# Patient Record
Sex: Female | Born: 1935 | Race: White | Hispanic: No | Smoking: Former smoker
Health system: Southern US, Community
[De-identification: ages and names within clinical notes are randomized; demographics above are authoritative.]

## PROBLEM LIST (undated history)

## (undated) DIAGNOSIS — E119 Type 2 diabetes mellitus without complications: Secondary | ICD-10-CM

## (undated) DIAGNOSIS — I10 Essential (primary) hypertension: Secondary | ICD-10-CM

## (undated) DIAGNOSIS — E7800 Pure hypercholesterolemia, unspecified: Secondary | ICD-10-CM

## (undated) HISTORY — PX: HERNIA REPAIR: SHX51

## (undated) HISTORY — PX: ABDOMINAL HYSTERECTOMY: SHX81

---

## 2004-08-18 ENCOUNTER — Ambulatory Visit: Payer: Self-pay | Admitting: Cardiology

## 2004-10-21 ENCOUNTER — Ambulatory Visit (HOSPITAL_COMMUNITY): Admission: RE | Admit: 2004-10-21 | Discharge: 2004-10-22 | Payer: Self-pay | Admitting: *Deleted

## 2004-12-23 ENCOUNTER — Ambulatory Visit: Admission: RE | Admit: 2004-12-23 | Discharge: 2004-12-23 | Payer: Self-pay | Admitting: *Deleted

## 2005-01-13 ENCOUNTER — Ambulatory Visit (HOSPITAL_COMMUNITY): Admission: RE | Admit: 2005-01-13 | Discharge: 2005-01-14 | Payer: Self-pay | Admitting: *Deleted

## 2005-02-01 ENCOUNTER — Ambulatory Visit: Payer: Self-pay | Admitting: Internal Medicine

## 2005-05-29 ENCOUNTER — Emergency Department: Payer: Self-pay | Admitting: Emergency Medicine

## 2006-02-05 ENCOUNTER — Ambulatory Visit: Payer: Self-pay | Admitting: Internal Medicine

## 2006-08-30 ENCOUNTER — Ambulatory Visit: Payer: Self-pay | Admitting: *Deleted

## 2006-10-31 ENCOUNTER — Ambulatory Visit: Payer: Self-pay | Admitting: Orthopaedic Surgery

## 2007-02-13 ENCOUNTER — Ambulatory Visit: Payer: Self-pay

## 2007-02-25 ENCOUNTER — Ambulatory Visit: Payer: Self-pay | Admitting: Otolaryngology

## 2007-05-30 ENCOUNTER — Ambulatory Visit: Payer: Self-pay | Admitting: Internal Medicine

## 2007-09-05 ENCOUNTER — Ambulatory Visit: Payer: Self-pay | Admitting: *Deleted

## 2007-10-31 ENCOUNTER — Ambulatory Visit: Payer: Self-pay | Admitting: Ophthalmology

## 2007-10-31 ENCOUNTER — Other Ambulatory Visit: Payer: Self-pay

## 2007-11-05 ENCOUNTER — Ambulatory Visit: Payer: Self-pay | Admitting: Pain Medicine

## 2007-11-11 ENCOUNTER — Ambulatory Visit: Payer: Self-pay | Admitting: Ophthalmology

## 2007-11-20 ENCOUNTER — Ambulatory Visit: Payer: Self-pay | Admitting: Pain Medicine

## 2008-02-03 ENCOUNTER — Ambulatory Visit: Payer: Self-pay | Admitting: Pain Medicine

## 2008-02-19 ENCOUNTER — Ambulatory Visit: Payer: Self-pay | Admitting: Unknown Physician Specialty

## 2008-02-25 ENCOUNTER — Ambulatory Visit: Payer: Self-pay | Admitting: Pain Medicine

## 2008-03-11 ENCOUNTER — Ambulatory Visit: Payer: Self-pay | Admitting: Physician Assistant

## 2008-05-07 ENCOUNTER — Ambulatory Visit: Payer: Self-pay | Admitting: Physician Assistant

## 2008-08-06 ENCOUNTER — Ambulatory Visit: Payer: Self-pay | Admitting: Physician Assistant

## 2008-08-18 ENCOUNTER — Ambulatory Visit: Payer: Self-pay | Admitting: Pain Medicine

## 2008-08-25 ENCOUNTER — Ambulatory Visit: Payer: Self-pay | Admitting: Internal Medicine

## 2008-08-26 ENCOUNTER — Inpatient Hospital Stay: Payer: Self-pay | Admitting: Internal Medicine

## 2008-09-04 ENCOUNTER — Ambulatory Visit: Payer: Self-pay | Admitting: Vascular Surgery

## 2008-09-09 ENCOUNTER — Ambulatory Visit: Payer: Self-pay | Admitting: Physician Assistant

## 2009-04-27 ENCOUNTER — Ambulatory Visit: Payer: Self-pay | Admitting: Pain Medicine

## 2009-05-24 ENCOUNTER — Ambulatory Visit: Payer: Self-pay | Admitting: Pain Medicine

## 2009-07-08 ENCOUNTER — Ambulatory Visit: Payer: Self-pay

## 2009-08-05 ENCOUNTER — Ambulatory Visit: Payer: Self-pay | Admitting: Pain Medicine

## 2009-08-23 ENCOUNTER — Ambulatory Visit: Payer: Self-pay | Admitting: Pain Medicine

## 2009-08-27 ENCOUNTER — Ambulatory Visit: Payer: Self-pay | Admitting: Internal Medicine

## 2009-09-07 ENCOUNTER — Ambulatory Visit: Payer: Self-pay | Admitting: Pain Medicine

## 2009-09-20 ENCOUNTER — Ambulatory Visit: Payer: Self-pay | Admitting: Pain Medicine

## 2009-10-26 ENCOUNTER — Ambulatory Visit: Payer: Self-pay | Admitting: Pain Medicine

## 2009-11-08 ENCOUNTER — Ambulatory Visit: Payer: Self-pay | Admitting: Pain Medicine

## 2010-06-24 ENCOUNTER — Ambulatory Visit: Payer: Self-pay

## 2010-08-16 NOTE — Procedures (Signed)
CAROTID DUPLEX EXAM   INDICATION:  Followup of known carotid artery disease.  Patient is  asymptomatic.   HISTORY:  Diabetes:  No.  Cardiac:  No.  Hypertension:  Yes.  Smoking:  No.  Previous Surgery:  CV History:  Patient has a history of renal artery FMD.  Amaurosis Fugax No, Paresthesias No, Hemiparesis No                                       RIGHT             LEFT  Brachial systolic pressure:         132               138  Brachial Doppler waveforms:         WNL               WNL  Vertebral direction of flow:        Antegrade         Antegrade  DUPLEX VELOCITIES (cm/sec)  CCA peak systolic                   72                67  ECA peak systolic                   95                92  ICA peak systolic                   103               149  ICA end diastolic                   27                35  PLAQUE MORPHOLOGY:                  Calcific          Mixed, calcific  PLAQUE AMOUNT:                      Mild-moderate     Moderate  PLAQUE LOCATION:                    Bifurcation, ICA  Bifurcation, ICA   IMPRESSION:  1. 20-39% internal carotid artery stenosis.  2. 40-59% internal carotid artery stenosis.  3. Patent external carotid artery stenoses.  4. Bilateral antegrade flow in vertebral arteries.  5. Study essentially unchanged from that on 08/30/06.     ___________________________________________  P. Liliane Bade, M.D.   PB/MEDQ  D:  09/05/2007  T:  09/05/2007  Job:  [id number]

## 2010-08-19 NOTE — H&P (Signed)
NAME:  Beth Leon, Beth Leon                 ACCOUNT NO.:  [id number]   MEDICAL RECORD NO.:  [id number]          PATIENT TYPE:  AMB   LOCATION:  DFTL                         FACILITY:  [facility]   PHYSICIAN:  Balinda Quails, M.D.    DATE OF BIRTH:  02-Jan-1928   DATE OF ADMISSION:  12/23/2004  DATE OF DISCHARGE:                                HISTORY & PHYSICAL   REASON FOR ADMISSION:  Renovascular hypertension.   HISTORY OF PRESENT ILLNESS:  The patient is a 77 year old white female who  was referred from Dr. Artelia Laroche for evaluation of renovascular  hypertension. The patient has had poorly controlled hypertension which has  been worsening despite maximum medications. Ultimately in the course of her  workup, she was referred to Dr. Dahlia Client and underwent cardiac  catheterization with abdominal aortogram. This showed well preserved  ejection fraction with no significant coronary artery disease, but she was  noted to have fibromuscular dysplasia in her left renal artery and into the  branches of the right renal artery. She was subsequently referred to Dr.  Liliane Bade for evaluation and consideration of possible renal artery  angioplasty and stenting. She did undergo a left renal artery angioplasty on  October 21, 2004. Since that time she has noted some improvement in her blood  pressures, mostly running in the 150-160 systolic range now. It was Dr.  Madilyn Fireman' opinion that at this time she should undergo a right renal artery  angioplasty and possible stent placement. She presents today for her  preoperative History and Physical.   PAST MEDICAL HISTORY:  1.  __________, asymptomatic (40-59% right internal carotid artery stenosis      and 60-79% left internal carotid artery stenosis by duplex).  2.  Hypertension.  3.  Dyslipidemia, diet controlled.  4.  History of gastritis and GI bleed in the past.  5.  History of diverticulosis and diverticulitis.  6.  History of left lower extremity DVT  previously on Coumadin now      discontinued.  7.  History of acute kidney failure following a bout of diverticulitis in      1997.   PAST SURGICAL HISTORY:  1.  Hiatal hernia repair with Nissen fundoplication.  2.  Hysterectomy.  3.  Bilateral oophorectomies.  4.  Rectocele repair with bladder tacking   CURRENT MEDICATIONS:  1.  Hydrochlorothiazide 25 mg daily.  2.  Lisinopril 40 mg daily.  3.  Sular 20 mg daily.  4.  Aspirin 325 mg daily.  5.  Calcium 500 mg twice daily.  6.  Multivitamin daily.   ALLERGIES:  No known drug allergies. However, she has intolerance to:  1.  CODEINE which causes nausea.  2.  LIPITOR which causes muscle aches and pains.  3.  BETA BLOCKERS which have caused significant bradycardia and hypotension.   SOCIAL HISTORY:  She is widowed and lives alone in [place]. She does have  three daughters in the area who assist her as needed. She is a retired Investment banker, operational for a trucking company. She previously smoked one to two  packs of cigarettes per day x20 years. She quit in March 21, 1974. She  consumes an occasional glass of wine with meals.   FAMILY HISTORY:  Her mother died at age 13 of Alzheimer's and also had  hypertension. Her father died at age 14 of coronary artery disease. She has  one brother who has had valvular heart disease and has undergone a valve  replacement; one sister who is living and has valvular heart disease; and  one sister who is living who has multiple sclerosis.   REVIEW OF SYSTEMS:  See History of Present Illness for pertinent positives  and negatives. Also she has some dyspnea on exertion, although it is felt  that this is not cardiac in origin. She does have periodic pedal edema as  well. She has chronic left lower extremity pain and swelling following a  DVT. She has generalized joint and muscle aches. She has most recently had  some problems with loose stools which have been occurring two to three times   daily following her last angiogram. Her family physician gave her a  prescription for some medication which seems to have helped, and her  symptoms are now improving. She denies fevers, chills, weight loss, recent  infections, visual changes, amaurosis fugax, dysphagia, dysarthria,  neurological symptoms, weakness, speech impairment, chest pain,  palpitations, shortness of breath, PND, orthopnea, cough, wheezing,  hemoptysis, abdominal pain, nausea, vomiting, reflux symptoms, hematochezia,  melena, hematemesis, hematuria, nocturia or dysuria, lower extremity  claudication symptoms or rest pain, nonhealing ulcers, anxiety, depression,  intolerance to heat or cold.   PHYSICAL EXAMINATION:  VITAL SIGNS:  Blood pressure is 140/82, heart rate is  84 and regular, respirations 18 unlabored.  GENERAL:  This is a well-developed, well-nourished white female in no acute  distress.  HEENT:  Normocephalic, atraumatic. Pupils equal, round and react to light  accommodation. Extraocular movements intact. Exam of the TMs and canals are  clear. Nares patent bilaterally. Oropharynx clear with moist mucous  membranes.  NECK:  Supple without lymphadenopathy, thyromegaly or carotid bruits.  HEART:  Regular rate and rhythm without murmurs, rubs or gallops.  LUNGS:  Clear to auscultation.  ABDOMEN:  Soft, nontender, nondistended with active bowel sounds in all  quadrants. No masses or hepatosplenomegaly.  EXTREMITIES:  There is mild left lower extremity edema with 2+ femoral,  dorsalis pedis and posterior tibial pulses bilaterally.  NEUROLOGIC: Cranial nerves II-XII are grossly intact. She is alert and  oriented x3. Gait is within normal limits.   ASSESSMENT/PLAN:  This is a 77 year old female with history of renovascular  hypertension who is status post a left renal artery percutaneous  transluminal angioplasty. She will be admitted to [facility] on December 24, 1994, and will undergo a right renal artery  percutaneous transluminal  angioplasty and stent placement performed by Dr. Liliane Bade.      Coral Ceo, P.A.      Balinda Quails, M.D.  Electronically Signed    GC/MEDQ  D:  12/21/2004  T:  12/21/2004  Job:  [id number]   cc:   Lenore Cordia, M.D.   Dahlia Client, M.D.

## 2010-08-19 NOTE — Op Note (Signed)
NAME:  Beth Leon, Beth Leon                 ACCOUNT NO.:  [id number]   MEDICAL RECORD NO.:  [id number]          PATIENT TYPE:  OIB   LOCATION:  5530                         FACILITY:  [facility]   PHYSICIAN:  Balinda Quails, M.D.    DATE OF BIRTH:  1927-11-18   DATE OF PROCEDURE:  01/13/2005  DATE OF DISCHARGE:                                 OPERATIVE REPORT   PHYSICIAN:  Denman George, MD   DIAGNOSIS:  Renovascular hypertension secondary to renal artery  fibromuscular dysplasia.   PROCEDURE:  1.  Selective left renal arteriogram.  2.  Selective right renal arteriogram.  3.  Angioplasty of superior first division superior right renal artery.  4.  Angioplasty of the first division inferior right renal artery.   ACCESS:  Left brachial artery 6-French sheath.   CONTRAST:  80 mL of Visipaque.   COMPLICATIONS:  None apparent   CLINICAL NOTE:  Beth Leon is a 77 year old female with poorly controlled  hypertension.  Workup for this revealed evidence of fibromuscular dysplasia  of the renal arteries bilaterally.  She has previously undergone left main  renal artery PTA for fibromuscular disease.  Following this, she did get an  improvement in blood pressure control, with improved blood pressure levels  and also reduction in medication.  Previous diagnostic arteriography has  revealed evidence of fibromuscular dysplasia changes in the superior and  inferior first division branches of the right renal artery.  She is brought  to the cath lab at this time for planned intervention of the fibromuscular  disease in her right kidney.   PROCEDURE NOTE:  The patient was brought to the cath lab in stable  condition.  Informed consent obtained.  Placed in supine position.  Left arm  prepped and draped in sterile fashion.   Patient administered 25 mcg of fentanyl intravenously, 1 mg of Versed  intravenously.  She did receive an additional 25 mcg of fentanyl during the  procedure.   Skin and  subcutaneous tissue in the left antecubital fossa were instilled  with 1% Xylocaine.  A needle was easily introduced into the left brachial  artery.  A 0.035 J-wire passed through the needle into the left brachial  artery.  The needle removed, a short 6-French sheath was then advanced into  the left brachial artery.  The J-wire removed.  The sheath flushed with  heparin and saline solution.  Instilled through the sheath were then 2000  units of heparin, and 200 mcg of intra-arterial nitroglycerin.   A 6-French multipurpose guide catheter was then engaged into the sheath.  A  0.035 J-wire was advanced through the catheter and up into the left brachial  artery.  Under fluoroscopy, this was advanced through the left axillary and  subclavian arteries into the aortic arch.  The guidewire coursed easily down  into the descending aorta.  The multipurpose catheter was advanced over the  guidewire.  The catheter was placed down in the distal abdominal aorta.  With hand injection of contrast, the origin of left renal artery was  identified.  This was encased easily  with a multipurpose catheter.  A 0.014  stabilizer guidewire was then advanced into the left renal artery and out  into the second division branches.  An attempt was then made to pass a wave  wire into the left renal artery.  This was advanced out into the second  division branches.  However, the wave wire did not function adequately and  accurate measurements of pressure were was not obtained.  Guidewires were  then removed from the left renal artery.   The right renal artery origin was identified with hand injection of  contrast.  The multipurpose catheter was engaged into the origin of the  right main renal artery.  Right renal arteriography obtained with hand  injection of contrast.  This did delineate clearly the fibromuscular disease  involving the right renal artery branches.  The main right renal artery  branched into a first  division superior and inferior pole branch.  The first  division branches were both significantly diseased with fibromuscular  dysplastic changes.   A 0.014 stabilizer guidewire was then advanced through the guide cath.  Using a torquing device, this was then advanced into the superior first  division branch of the right renal artery.  This was then advanced out into  the distal branches to engage the wire well.  A second 0.014 stabilizer  guidewire was then advanced through the guide catheter and using a torquing  device, this was then advanced into the inferior first division branch of  the left renal artery.  With these 2 guidewires in place, intervention was  then undertaken.   The superior first division left renal artery was dilated with a 5 x 2  Aviator balloon, 2 dilatations carried out at 4 atmospheres x30 seconds.  This balloon was then removed and a completion arteriogram obtained.  This  did reveal improvement in the fibromuscular dysplastic changes of the right  upper pole first division branch.   A 2.75 x 20 Maverick balloon was then advanced into the inferior first  division branch of the right main renal artery.  Two inflations were carried  out with the Maverick balloon at 6 atmospheres x30 seconds.  Following these  inflations, completion arteriography again obtained.  This revealed improved  appearance of the fibromuscular disease in the inferior pole first division  branch.   This completed the intervention procedure.  There were no apparent  complications.  The guidewires were then removed along with the multipurpose  guide catheter.   Total contrast load was 80 mL of Visipaque.  ACT measured prior to  intervention was 256 seconds.   The patient was transferred to the holding area in stable condition.   FINAL IMPRESSION:  1.  Fibromuscular dysplastic changes of the first division superior and      inferior right renal artery. 2.  Successful angioplasty of  fibromuscular displaced thick changes in the      right superior and inferior first division branches of the renal artery      without apparent complication.   DISPOSITION:  These results have been reviewed with the patient and family.  The patient will be observed overnight for planned discharge home tomorrow.   The patient is instructed to follow up with Dr. Graciela Husbands regarding blood  pressure control and any adjustment in medications.  Followup in the office  will be arranged for approximately 1 month.      Balinda Quails, M.D.  Electronically Signed     PGH/MEDQ  D:  01/13/2005  T:  01/14/2005  Job:  [id number]   cc:   Almon Register, [place], [place] Daniel Nones MD   [facility] Peripheral Vascular Cath Lab   [place], [place] Harold Hedge MD

## 2010-08-19 NOTE — Op Note (Signed)
NAME:  Beth Leon, Beth Leon                 ACCOUNT NO.:  [id number]   MEDICAL RECORD NO.:  [id number]          PATIENT TYPE:  OIB   LOCATION:  2899                         FACILITY:  [facility]   PHYSICIAN:  Balinda Quails, M.D.    DATE OF BIRTH:  [birth year]   DATE OF PROCEDURE:  10/21/2004  DATE OF DISCHARGE:                                 OPERATIVE REPORT   DIAGNOSIS:  Renal vascular hypertension.   PROCEDURE:  1.  Bilateral selective renal arteriogram.  2.  Left renal artery PTA.   ACCESS:  Right common femoral artery 6-French sheath.   CONTRAST:  90 mL Visipaque.   COMPLICATIONS:  None apparent.   CLINICAL NOTE:  Jeanenne Leon is a 77 year old female referred to the office  for evaluation of accelerated hypertension.  During recent heart  catheterization, she was found to have evidence of fibromuscular dysplasia  of the renal arteries.  She is brought to the cath lab at this time for  elective renal arteriography and possible percutaneous intervention.   PROCEDURE NOTE:  The patient brought to the cath lab in stable condition.  Placed in supine position.   The right groin prepped and draped in sterile fashion.  Administered 1 mg of  Versed, 25 mcg of fentanyl intravenously.  Skin and subcutaneous tissue on  the right groin instilled with 1% Xylocaine.  A needle was easily introduced  in the right common femoral artery.  A 0.035 J-wire passed through the  needle into the mid abdominal aorta.   A right coronary catheter was then advanced over the guidewire.  The  guidewire removed.  The right coronary catheter engaged into the left main  renal artery.  Selective left renal arteriography obtained.  There was a  long main renal artery.  There was extensive fibromuscular change with a  [place] lake type appearance in the distal left renal artery.  This did not  appear to involve the first division branches.   The right coronary catheter was then disengaged from the left main renal  artery.  Attempts were made to cannulate the right main renal artery.  These  were unsuccessful.  Therefore, a pigtail catheter was inserted and an  aortogram obtained.  The aortogram did reveal the origin of the right main  renal artery.  Guidewire exchange then made for an IMA catheter.  The IMA  catheter was engaged in the right main renal artery.  Right renal  arteriography obtained.   Right renal arteriography did reveal a short right main renal artery.  No  significant fibromuscular changes in the main renal artery on the right  side.  There was, however, extensive fibromuscular dysplasia changes present  in the first division branches of the right renal artery, the superior and  inferior branches.   The IMA catheter was then disengaged from the right main renal artery.  J-  wire inserted.  An IMA guide catheter was then inserted over the guidewire.  The patient administered 4000 units heparin intravenously, subsequent 1000  units heparin intravenously.  ACT measured 228.   The IMA guide  was then engaged into the left main renal artery.  Left renal  arteriography again obtained with hand injection of contrast.  This clearly  delineated the fibromuscular dysplasia changes of the left main renal  artery.  A 5 x 2 aviator balloon was then advanced over the guidewire.  The  aviator balloon was inflated at 4 atmospheres x 20 seconds, 6 atmospheres x  20 seconds, 4 atmospheres x 20 seconds, and 8 atmospheres x 20 seconds.   Completion arteriography then obtained following angioplasty of the left  main renal artery.  This did reveal marked improvement in the fibromuscular  dysplasia that was in the left main renal artery.  There were no apparent  complications.  The balloon and guidewire were removed from the left main  renal artery.  The J wire reinserted and the IMA guide removed.  Right  femoral sheath left in place until ACT appropriate for removal.  There were  no apparent  complications.  Total contrast 90 mL Visipaque.   FINAL IMPRESSION:  1.  Extensive fibromuscular dysplasia of the left main renal artery.  2.  Extensive fibromuscular dysplasia of the superior and inferior first      division branches of the left renal artery.  3.  Successful left main renal artery angioplasty with 5 x 2 aviator balloon      without apparent complication.   DISPOSITION:  These results have been reviewed with the patient and family.  The patient will be observed overnight, planned discharge tomorrow.   Follow up in the office in 3-4 weeks.  At that time, she will have a carotid  Doppler evaluation.  Planned intervention of the right renal artery to  follow.       PGH/MEDQ  D:  10/21/2004  T:  10/21/2004  Job:  [id number]   cc:   [facility] Peripheral Vascular Cath Lab   Harold Hedge, MD  [place], [place]   Daniel Nones, MD  [place], [place]

## 2010-08-19 NOTE — Op Note (Signed)
NAME:  Beth Leon, Beth Leon                 ACCOUNT NO.:  [id number]   MEDICAL RECORD NO.:  [id number]          PATIENT TYPE:  AMB   LOCATION:  DFTL                         FACILITY:  [facility]   PHYSICIAN:  Balinda Quails, M.D.    DATE OF BIRTH:  October 28, 1927   DATE OF PROCEDURE:  12/23/2004  DATE OF DISCHARGE:  12/23/2004                                 OPERATIVE REPORT   PHYSICIAN:  Denman George, MD.   DIAGNOSIS:  Renovascular hypertension secondary to renal artery  fibromuscular dysplasia.   PROCEDURE:  Bilateral selective renal arteriogram.   ACCESS:  Right common femoral artery 6-French sheath.   CONTRAST:  70 mL of Visipaque.   COMPLICATIONS:  None apparent.   CLINICAL NOTE:  Beth Leon is a 77 year old female with known fibromuscular  dysplasia of the renal arteries.  She has poorly-controlled hypertension.  She has previously undergone left renal PTA for treatment of left main renal  artery fibromuscular dysplasia.  She has had a good physiologic response to  this treatment with discontinuation of 1.5 of her current antihypertensive  medications.  She is brought back to the cath lab at this time for repeat  diagnostic arteriography, reevaluation of the left renal PTA and possible  right renal PTA.   PROCEDURE NOTE:  The patient was brought to the cath lab in stable  condition.  Placed in supine position.  Right groin was prepped and draped  in a sterile fashion.   Skin and subcutaneous tissue were instilled with 1% Xylocaine.  The patient  was administered 25 mcg of fentanyl intravenously, 1 mg of Versed  intravenously.   A needle was easily introduced into the right common femoral artery.  A  0.035 J-wire was passed through the needle into the mid-abdominal aorta.  The needle was removed, 6-French sheath advanced over the guidewire.  Dilator was removed and sheath flushed with heparin and saline solution.  An  IMA guide catheter was then advanced over the guidewire.  This  was easily  engaged in the left main renal artery.  Hand injection of contrast made to  evaluate the left main renal artery.  This revealed an excellent technical  result to the previous PTA.  The area of fibromuscular dysplasia appeared  well- relieved.  There was brisk flow.   An attempt was then made to pass a pressure wire across this.  However,  there was malfunction of the pressure wire and this procedure was aborted.   The IMA catheter was then engaged into the right main renal artery.  The  right main renal artery had a marked inferior course.  This was very  difficult to engage.  Right main renal arteriography was made with hand  injection.  This revealed the right main renal artery to be relatively free  of fibromuscular disease.  There were a pair of first division branches  which revealed marked change of fibromuscular dysplasia.   Several attempts were made to engage the guide catheter well into the right  main renal artery.  However, the aorta was quite narrow in caliber.  Attempts were made with an IMA guide, also with a renal double-curved guide.  These, however, were unsatisfactory.   At this time, it was decided to discontinue the procedure.   The guide catheter was removed over a wire.  Right femoral sheath removed.  No apparent complications.   FINAL IMPRESSION:  1.  Bilateral renal artery fibromuscular dysplasia.  2.  Well-treated left main renal artery fibromuscular dysplasia, status post      percutaneous transluminal angioplasty.  3.  Fibromuscular dysplasia of the first division branches of right renal      artery.   DISPOSITION:  The patient will be electively scheduled for return for access  via the left brachial approach for scheduled PTA of the fibromuscular  disease of the right renal artery.      Balinda Quails, M.D.  Electronically Signed     PGH/MEDQ  D:  12/23/2004  T:  12/24/2004  Job:  [id number]   cc:   [facility] Peripheral Vascular Cath Lab

## 2011-10-25 ENCOUNTER — Ambulatory Visit: Payer: Self-pay | Admitting: Pain Medicine

## 2011-12-19 ENCOUNTER — Ambulatory Visit: Payer: Self-pay | Admitting: Internal Medicine

## 2012-01-18 ENCOUNTER — Ambulatory Visit: Payer: Self-pay | Admitting: Internal Medicine

## 2012-10-15 ENCOUNTER — Ambulatory Visit: Payer: Self-pay | Admitting: Internal Medicine

## 2015-09-05 ENCOUNTER — Emergency Department
Admission: EM | Admit: 2015-09-05 | Discharge: 2015-09-05 | Disposition: A | Discharge status: Home / Self Care | Payer: Medicare Other | Attending: Emergency Medicine | Admitting: Emergency Medicine

## 2015-09-05 ENCOUNTER — Encounter: Payer: Self-pay | Admitting: Medical Oncology

## 2015-09-05 DIAGNOSIS — T7840XA Allergy, unspecified, initial encounter: Secondary | ICD-10-CM | POA: Diagnosis present

## 2015-09-05 DIAGNOSIS — E119 Type 2 diabetes mellitus without complications: Secondary | ICD-10-CM | POA: Diagnosis not present

## 2015-09-05 DIAGNOSIS — R229 Localized swelling, mass and lump, unspecified: Secondary | ICD-10-CM | POA: Insufficient documentation

## 2015-09-05 DIAGNOSIS — K0889 Other specified disorders of teeth and supporting structures: Secondary | ICD-10-CM | POA: Diagnosis not present

## 2015-09-05 DIAGNOSIS — I10 Essential (primary) hypertension: Secondary | ICD-10-CM | POA: Diagnosis not present

## 2015-09-05 HISTORY — DX: Pure hypercholesterolemia, unspecified: E78.00

## 2015-09-05 HISTORY — DX: Type 2 diabetes mellitus without complications: E11.9

## 2015-09-05 HISTORY — DX: Essential (primary) hypertension: I10

## 2015-09-05 MED ORDER — PENICILLIN V POTASSIUM 250 MG PO TABS
500.0000 mg | ORAL_TABLET | Freq: Once | ORAL | Status: AC
  Administered 2015-09-05: 500 mg via ORAL
  Filled 2015-09-05: qty 2

## 2015-09-05 MED ORDER — PENICILLIN V POTASSIUM 500 MG PO TABS: 500.0000 mg | ORAL_TABLET | Freq: Four times a day (QID) | ORAL | Status: DC

## 2015-09-05 NOTE — ED Provider Notes (Signed)
[facility] Emergency Department Provider Note   ____________________________________________  Time seen: Approximately 945 AM  I have reviewed the triage vital signs and the nursing notes.   HISTORY  Chief Complaint Allergic Reaction   HPI Beth Leon is a 82 y.o. female with a history of tooth fracture to her front lower 4 teeth was presenting with pain over those teeth as well as swelling to the mandible anteriorly this morning. She says that she has been having pain over the said teeth over the past several days. She says that last night the pain became worse and she woke up with swelling to the front of her mandible today. She says ever since waking up and sitting upright that the swelling has gone down. There was no complaint of any difficulty swallowing. The patient has been eating yogurt and other soft foods. She has a Education officer, community and says she will be able to call a dentist tomorrow for further guidance. She is on lisinopril but she has been on lisinopril 40 years without incident.   Past Medical History  Diagnosis Date  . Hypertension   . Diabetes mellitus without complication (HCC)   . High cholesterol     There are no active problems to display for this patient.   No past surgical history on file.  No current outpatient prescriptions on file.  Allergies Codeine  No family history on file.  Social History Social History  Substance Use Topics  . Smoking status: None  . Smokeless tobacco: None  . Alcohol Use: None    Review of Systems Constitutional: No fever/chills Eyes: No visual changes. ENT: No sore throat. Cardiovascular: Denies chest pain. Respiratory: Denies shortness of breath. Gastrointestinal: No abdominal pain.  No nausea, no vomiting.  No diarrhea.  No constipation. Genitourinary: Negative for dysuria. Musculoskeletal: Negative for back pain. Skin: Negative for rash. Neurological: Negative for headaches, focal  weakness or numbness.  10-point ROS otherwise negative.  ____________________________________________   PHYSICAL EXAM:  VITAL SIGNS: ED Triage Vitals  Enc Vitals Group     BP 09/05/15 0936 150/75 mmHg     Pulse Rate 09/05/15 0936 60     Resp 09/05/15 0936 18     Temp 09/05/15 0936 99 F (37.2 C)     Temp Source 09/05/15 0936 Oral     SpO2 09/05/15 0936 100 %     Weight 09/05/15 0936 170 lb (77.111 kg)     Height 09/05/15 0936  (1.626 m)     Head Cir --      Peak Flow --      Pain Score 09/05/15 0936 5     Pain Loc --      Pain Edu? --      Excl. in GC? --     Constitutional: Alert and oriented. Well appearing and in no acute distress. Eyes: Conjunctivae are normal. PERRL. EOMI. Head: Atraumatic. Nose: No congestion/rhinnorhea. Mouth/Throat: Mucous membranes are moist.  Oropharynx non-erythematous.Vision to the front 4, lower teeth. There is tenderness over these teeth as well. There are no loose teeth. Tenderness over the anterior mandible as well. Mild swelling to the soft tissues of the anterior mandible. No edema to the floor of the mouth. The patient does not have trismus. Normal voice. Controlling her secretions. Neck: No stridor.   Cardiovascular: Normal rate, regular rhythm. Grossly normal heart sounds.  Respiratory: Normal respiratory effort.  No retractions. Lungs CTAB. Gastrointestinal: Soft and nontender. No distention.  Musculoskeletal: No  lower extremity tenderness nor edema.  No joint effusions. Neurologic:  Normal speech and language. No gross focal neurologic deficits are appreciated. Skin:  Skin is warm, dry and intact. No rash noted. Psychiatric: Mood and affect are normal. Speech and behavior are normal.  ____________________________________________   LABS (all labs ordered are listed, but only abnormal results are displayed)  Labs Reviewed - No data to  display ____________________________________________  EKG   ____________________________________________  RADIOLOGY   ____________________________________________   PROCEDURES  ____________________________________________   INITIAL IMPRESSION / ASSESSMENT AND PLAN / ED COURSE  Pertinent labs & imaging results that were available during my care of the patient were reviewed by me and considered in my medical decision making (see chart for details).  We'll treat patient with penicillin. She said that she was able to call her dentist tomorrow. She has tramadol at home for pain control. She says that she has had discussions in the past about how to proceed with her lower frontal teeth and there is been discussion about removing them and doing either partial or implants. She will discuss this further. ____________________________________________   FINAL CLINICAL IMPRESSION(S) / ED DIAGNOSES  Dental pain with localized swelling.    NEW MEDICATIONS STARTED DURING THIS VISIT:  New Prescriptions   No medications on file     Note:  This document was prepared using Dragon voice recognition software and may include unintentional dictation errors.    Myrna Blazeravid Matthew Schaevitz, MD 09/05/15 1026

## 2015-09-05 NOTE — ED Notes (Signed)
Pt ambulatory to triage with reports that last night she noticed her lips swelling, this am pt reports swelling is a bit better but she feels like her tongue may be swelling some.

## 2016-02-29 ENCOUNTER — Other Ambulatory Visit: Payer: Self-pay | Admitting: Internal Medicine

## 2016-02-29 DIAGNOSIS — M25561 Pain in right knee: Secondary | ICD-10-CM

## 2016-03-09 ENCOUNTER — Ambulatory Visit: Payer: Medicare Other

## 2016-03-09 ENCOUNTER — Emergency Department: Payer: Medicare Other

## 2016-03-09 ENCOUNTER — Emergency Department
Admission: EM | Admit: 2016-03-09 | Discharge: 2016-03-09 | Disposition: A | Discharge status: Home / Self Care | Payer: Medicare Other | Attending: Student in an Organized Health Care Education/Training Program | Admitting: Student in an Organized Health Care Education/Training Program

## 2016-03-09 DIAGNOSIS — S161XXA Strain of muscle, fascia and tendon at neck level, initial encounter: Secondary | ICD-10-CM | POA: Insufficient documentation

## 2016-03-09 DIAGNOSIS — Y929 Unspecified place or not applicable: Secondary | ICD-10-CM | POA: Insufficient documentation

## 2016-03-09 DIAGNOSIS — M79631 Pain in right forearm: Secondary | ICD-10-CM | POA: Diagnosis not present

## 2016-03-09 DIAGNOSIS — Z7982 Long term (current) use of aspirin: Secondary | ICD-10-CM | POA: Insufficient documentation

## 2016-03-09 DIAGNOSIS — Y9301 Activity, walking, marching and hiking: Secondary | ICD-10-CM | POA: Diagnosis not present

## 2016-03-09 DIAGNOSIS — S199XXA Unspecified injury of neck, initial encounter: Secondary | ICD-10-CM | POA: Diagnosis present

## 2016-03-09 DIAGNOSIS — Y999 Unspecified external cause status: Secondary | ICD-10-CM | POA: Diagnosis not present

## 2016-03-09 DIAGNOSIS — R55 Syncope and collapse: Secondary | ICD-10-CM | POA: Diagnosis not present

## 2016-03-09 DIAGNOSIS — W19XXXA Unspecified fall, initial encounter: Secondary | ICD-10-CM

## 2016-03-09 DIAGNOSIS — I10 Essential (primary) hypertension: Secondary | ICD-10-CM | POA: Insufficient documentation

## 2016-03-09 DIAGNOSIS — Z79899 Other long term (current) drug therapy: Secondary | ICD-10-CM | POA: Insufficient documentation

## 2016-03-09 DIAGNOSIS — W1839XA Other fall on same level, initial encounter: Secondary | ICD-10-CM | POA: Diagnosis not present

## 2016-03-09 DIAGNOSIS — Z87891 Personal history of nicotine dependence: Secondary | ICD-10-CM | POA: Diagnosis not present

## 2016-03-09 DIAGNOSIS — E119 Type 2 diabetes mellitus without complications: Secondary | ICD-10-CM | POA: Diagnosis not present

## 2016-03-09 DIAGNOSIS — Z7984 Long term (current) use of oral hypoglycemic drugs: Secondary | ICD-10-CM | POA: Diagnosis not present

## 2016-03-09 DIAGNOSIS — M79601 Pain in right arm: Secondary | ICD-10-CM

## 2016-03-09 LAB — COMPREHENSIVE METABOLIC PANEL
ALBUMIN: 3.6 g/dL (ref 3.5–5.0)
ALK PHOS: 55 U/L (ref 38–126)
ALT: 16 U/L (ref 14–54)
AST: 24 U/L (ref 15–41)
Anion gap: 9 (ref 5–15)
BILIRUBIN TOTAL: 1.2 mg/dL (ref 0.3–1.2)
BUN: 24 mg/dL — AB (ref 6–20)
CALCIUM: 10.1 mg/dL (ref 8.9–10.3)
CO2: 26 mmol/L (ref 22–32)
CREATININE: 1.25 mg/dL — AB (ref 0.44–1.00)
Chloride: 100 mmol/L — ABNORMAL LOW (ref 101–111)
GFR calc Af Amer: 44 mL/min — ABNORMAL LOW (ref >60)
GFR calc non Af Amer: 38 mL/min — ABNORMAL LOW (ref >60)
GLUCOSE: 117 mg/dL — AB (ref 65–99)
POTASSIUM: 4.1 mmol/L (ref 3.5–5.1)
Sodium: 135 mmol/L (ref 135–145)
TOTAL PROTEIN: 7.4 g/dL (ref 6.5–8.1)

## 2016-03-09 LAB — URINALYSIS, COMPLETE (UACMP) WITH MICROSCOPIC
BACTERIA UA: NONE SEEN
BILIRUBIN URINE: NEGATIVE
Glucose, UA: NEGATIVE mg/dL
Hgb urine dipstick: NEGATIVE
Ketones, ur: 5 mg/dL — AB
Nitrite: NEGATIVE
Protein, ur: 30 mg/dL — AB
SPECIFIC GRAVITY, URINE: 1.018 (ref 1.005–1.030)
pH: 6 (ref 5.0–8.0)

## 2016-03-09 LAB — CBC WITH DIFFERENTIAL/PLATELET
BASOS ABS: 0.1 10*3/uL (ref 0–0.1)
BASOS PCT: 1 %
Eosinophils Absolute: 0.1 10*3/uL (ref 0–0.7)
Eosinophils Relative: 1 %
HEMATOCRIT: 39.4 % (ref 35.0–47.0)
HEMOGLOBIN: 13.4 g/dL (ref 12.0–16.0)
LYMPHS PCT: 10 %
Lymphs Abs: 1 10*3/uL (ref 1.0–3.6)
MCH: 29.7 pg (ref 26.0–34.0)
MCHC: 34 g/dL (ref 32.0–36.0)
MCV: 87.2 fL (ref 80.0–100.0)
Monocytes Absolute: 1.2 10*3/uL — ABNORMAL HIGH (ref 0.2–0.9)
Monocytes Relative: 11 %
NEUTROS ABS: 7.9 10*3/uL — AB (ref 1.4–6.5)
NEUTROS PCT: 77 %
Platelets: 199 10*3/uL (ref 150–440)
RBC: 4.52 MIL/uL (ref 3.80–5.20)
RDW: 14 % (ref 11.5–14.5)
WBC: 10.2 10*3/uL (ref 3.6–11.0)

## 2016-03-09 LAB — TROPONIN I: Troponin I: <0.03 ng/mL (ref <0.03)

## 2016-03-09 MED ORDER — CYCLOBENZAPRINE HCL 10 MG PO TABS: 10.0000 mg | ORAL_TABLET | Freq: Every day | ORAL | 0 refills | Status: DC

## 2016-03-09 MED ORDER — FENTANYL CITRATE (PF) 100 MCG/2ML IJ SOLN
25.0000 ug | INTRAMUSCULAR | Status: DC | PRN
  Administered 2016-03-09 (×2): 25 ug via INTRAVENOUS
  Filled 2016-03-09 (×2): qty 2

## 2016-03-09 NOTE — ED Notes (Signed)
Pt presents with neck pain since Saturday after a fall on Thursday. States that she didn't initially have trouble but that she began to have pain a couple of days later. Pt also reports pain in back. Pt not sure if she lost consciousness and doesn't remember fall. Pt alert & oriented with NAD noted. Pt needs to keep head bowed for comfort.

## 2016-03-09 NOTE — ED Provider Notes (Signed)
[facility] Emergency Department Provider Note    None    (approximate)  I have reviewed the triage vital signs and the nursing notes.   HISTORY  Chief Complaint Fall    HPI Beth Leon is a 82 y.o. female  who presents with worsening neck pain and neck stiffening with difficulty lifting her head after falling on Thursday. Patient states she was walking down the stairs and landed where she then had a fall. Patient is amnestic to the exact events causing the fall. She denies any chest pain or shortness of breath prior to the fall. She went to see her PCP on Friday and had bandage of her right arm. Did not have any neck pain at that time. The past 3 days is been having worsening neck pain and now is unable to lift her head secondary to pain. Denies any numbness or tingling. No headache. Denies any chest pain or abdominal pain. No difficulty walking.   Past Medical History:  Diagnosis Date  . Diabetes mellitus without complication (HCC)   . High cholesterol   . Hypertension    No family history on file. Past Surgical History:  Procedure Laterality Date  . ABDOMINAL HYSTERECTOMY    . HERNIA REPAIR     There are no active problems to display for this patient.     Prior to Admission medications   Medication Sig Start Date End Date Taking? Authorizing Provider  amLODipine (NORVASC) 10 MG tablet Take 10 mg by mouth daily.   Yes Historical Provider, MD  aspirin EC 81 MG tablet Take 81 mg by mouth at bedtime.   Yes Historical Provider, MD  docusate sodium (COLACE) 100 MG capsule Take 200 mg by mouth daily.   Yes Historical Provider, MD  gabapentin (NEURONTIN) 300 MG capsule Take 300 mg by mouth 2 (two) times daily.   Yes Historical Provider, MD  glimepiride (AMARYL) 1 MG tablet Take 1 mg by mouth daily with breakfast.   Yes Historical Provider, MD  lisinopril (PRINIVIL,ZESTRIL) 40 MG tablet Take 40 mg by mouth daily.   Yes Historical Provider, MD    omeprazole (PRILOSEC) 20 MG capsule Take 20 mg by mouth daily.   Yes Historical Provider, MD  pravastatin (PRAVACHOL) 80 MG tablet Take 80 mg by mouth at bedtime.   Yes Historical Provider, MD  tolterodine (DETROL LA) 4 MG 24 hr capsule Take 4 mg by mouth daily.   Yes Historical Provider, MD  traMADol (ULTRAM) 50 MG tablet Take 50 mg by mouth 2 (two) times daily.   Yes Historical Provider, MD  cyclobenzaprine (FLEXERIL) 10 MG tablet Take 1 tablet (10 mg total) by mouth at bedtime. 03/09/16   Willy Eddy, MD  penicillin v potassium (VEETID) 500 MG tablet Take 1 tablet (500 mg total) by mouth 4 (four) times daily. Patient not taking: Reported on 03/09/2016 09/05/15   Myrna Blazer, MD    Allergies Codeine    Social History Social History  Substance Use Topics  . Smoking status: Former Games developer  . Smokeless tobacco: Never Used  . Alcohol use No    Review of Systems Patient denies headaches, rhinorrhea, blurry vision, numbness, shortness of breath, chest pain, edema, cough, abdominal pain, nausea, vomiting, diarrhea, dysuria, fevers, rashes or hallucinations unless otherwise stated above in HPI. ____________________________________________   PHYSICAL EXAM:  VITAL SIGNS: Vitals:   03/09/16 0907 03/09/16 0912  BP: (!) 155/80   Pulse: 93   Resp: 18   Temp:  98 F (36.7 C)    Constitutional: Alert and oriented.  in no acute distress. Eyes: Conjunctivae are normal. PERRL. EOMI. Head: Atraumatic. Nose: No congestion/rhinnorhea. Mouth/Throat: Mucous membranes are moist.  Oropharynx non-erythematous. Neck: No stridor. midline cervical spine tenderness to palpation.  Unable to range 2/2 pain.  Placed in cervical collar Hematological/Lymphatic/Immunilogical: No cervical lymphadenopathy. Cardiovascular: Normal rate, regular rhythm. Grossly normal heart sounds.  Good peripheral circulation. Respiratory: Normal respiratory effort.  No retractions. Lungs  CTAB. Gastrointestinal: Soft and nontender. No distention. No abdominal bruits. No CVA tenderness. Musculoskeletal: No lower extremity tenderness nor edema.  No joint effusions. Neurologic:  Normal speech and language. No gross focal neurologic deficits are appreciated. No gait instability. Skin:  Skin is warm, dry and intact. No rash noted.  Superficial abrasion to right lateral forearm Psychiatric: Mood and affect are normal. Speech and behavior are normal.  ____________________________________________   LABS (all labs ordered are listed, but only abnormal results are displayed)  Results for orders placed or performed during the hospital encounter of 03/09/16 (from the past 24 hour(s))  CBC with Differential/Platelet     Status: Abnormal   Collection Time: 03/09/16  9:31 AM  Result Value Ref Range   WBC 10.2 3.6 - 11.0 K/uL   RBC 4.52 3.80 - 5.20 MIL/uL   Hemoglobin 13.4 12.0 - 16.0 g/dL   HCT 16.139.4 09.635.0 - 04.547.0 %   MCV 87.2 80.0 - 100.0 fL   MCH 29.7 26.0 - 34.0 pg   MCHC 34.0 32.0 - 36.0 g/dL   RDW 40.914.0 81.111.5 - 91.414.5 %   Platelets 199 150 - 440 K/uL   Neutrophils Relative % 77 %   Neutro Abs 7.9 (H) 1.4 - 6.5 K/uL   Lymphocytes Relative 10 %   Lymphs Abs 1.0 1.0 - 3.6 K/uL   Monocytes Relative 11 %   Monocytes Absolute 1.2 (H) 0.2 - 0.9 K/uL   Eosinophils Relative 1 %   Eosinophils Absolute 0.1 0 - 0.7 K/uL   Basophils Relative 1 %   Basophils Absolute 0.1 0 - 0.1 K/uL  Comprehensive metabolic panel     Status: Abnormal   Collection Time: 03/09/16  9:31 AM  Result Value Ref Range   Sodium 135 135 - 145 mmol/L   Potassium 4.1 3.5 - 5.1 mmol/L   Chloride 100 (L) 101 - 111 mmol/L   CO2 26 22 - 32 mmol/L   Glucose, Bld 117 (H) 65 - 99 mg/dL   BUN 24 (H) 6 - 20 mg/dL   Creatinine, Ser 7.821.25 (H) 0.44 - 1.00 mg/dL   Calcium 95.610.1 8.9 - 21.310.3 mg/dL   Total Protein 7.4 6.5 - 8.1 g/dL   Albumin 3.6 3.5 - 5.0 g/dL   AST 24 15 - 41 U/L   ALT 16 14 - 54 U/L   Alkaline Phosphatase 55  38 - 126 U/L   Total Bilirubin 1.2 0.3 - 1.2 mg/dL   GFR calc non Af Amer 38 (L) >60 mL/min   GFR calc Af Amer 44 (L) >60 mL/min   Anion gap 9 5 - 15  Troponin I     Status: None   Collection Time: 03/09/16  9:31 AM  Result Value Ref Range   Troponin I <0.03 <0.03 ng/mL  Urinalysis, Complete w Microscopic     Status: Abnormal   Collection Time: 03/09/16  1:30 PM  Result Value Ref Range   Color, Urine YELLOW (A) YELLOW   APPearance CLEAR (A) CLEAR   Specific  Gravity, Urine 1.018 1.005 - 1.030   pH 6.0 5.0 - 8.0   Glucose, UA NEGATIVE NEGATIVE mg/dL   Hgb urine dipstick NEGATIVE NEGATIVE   Bilirubin Urine NEGATIVE NEGATIVE   Ketones, ur 5 (A) NEGATIVE mg/dL   Protein, ur 30 (A) NEGATIVE mg/dL   Nitrite NEGATIVE NEGATIVE   Leukocytes, UA TRACE (A) NEGATIVE   RBC / HPF 0-5 0 - 5 RBC/hpf   WBC, UA 0-5 0 - 5 WBC/hpf   Bacteria, UA NONE SEEN NONE SEEN   Squamous Epithelial / LPF 0-5 (A) NONE SEEN   Mucous PRESENT    ____________________________________________  ____________________________________________  RADIOLOGY  I personally reviewed all radiographic images ordered to evaluate for the above acute complaints and reviewed radiology reports and findings.  These findings were personally discussed with the patient.  Please see medical record for radiology report.  ____________________________________________   PROCEDURES  Procedure(s) performed: none Procedures    Critical Care performed: no ____________________________________________   INITIAL IMPRESSION / ASSESSMENT AND PLAN / ED COURSE  Pertinent labs & imaging results that were available during my care of the patient were reviewed by me and considered in my medical decision making (see chart for details).  DDX: sah, iph, sdh, fracture, dislocation, torticollis  Beth Leon is a 82 y.o. who presents to the ED with mechanical fall early this week with neck pain. Patient is AFVSS in ED. Exam as above. Given  current presentation have considered the above differential.  Will order CT head and neck to evaluate for acute traumatic injury. Patient was placed in c-collar on arrival. We'll order x-ray imaging of the right forearm evaluate for any evidence of fracture. History does sound consistent with mechanical check blood work to evaluate for any dehydration or O abnormalities  Clinical Course as of Mar 09 1525  Thu Mar 09, 2016  1104 CT head with NAICA.  CT c-spine with paravertebral edema.  Concern for ligamentous injury will further evaluate with MRI.  [PR]  1305 MRI shows evidence of cervical strain without ligamentous injury.  [PR]  1310 X-ray of right forearm were reviewed. Patient has no distal wrist tenderness to palpation.  No snuffbox tenderness. It do not feel this clinically consistent with acute wrist injury. Superficial abrasion as previously noted and clinic. No evidence of elbow fracture.  [PR]  1314 Renal function at baseline.  [PR]  1426 UA without evidence of UTI.  Will DC home with muscle relaxants, strict return precautions and f/u with neurosurgery.  Patient was able to tolerate PO and was able to ambulate with a steady gait.  Have discussed with the patient and available family all diagnostics and treatments performed thus far and all questions were answered to the best of my ability. The patient demonstrates understanding and agreement with plan.   [PR]    Clinical Course User Index [PR] Willy Eddy, MD     ____________________________________________   FINAL CLINICAL IMPRESSION(S) / ED DIAGNOSES  Final diagnoses:  Arm pain, anterior, right  Acute strain of neck muscle, initial encounter  Right forearm pain  Fall from standing, initial encounter      NEW MEDICATIONS STARTED DURING THIS VISIT:  Discharge Medication List as of 03/09/2016  2:32 PM    START taking these medications   Details  cyclobenzaprine (FLEXERIL) 10 MG tablet Take 1 tablet (10 mg total) by  mouth at bedtime., Starting Thu 03/09/2016, Print         Note:  This document was prepared using  [employer]agon [employer] engineervoice recognition software and may include unintentional dictation errors.    Willy EddyPatrick Alinda Egolf, MD 03/09/16 [phone]

## 2016-03-09 NOTE — Discharge Instructions (Signed)
Return for any numbness or tingling, weakness or intractable pain.  Follow up with PCP.

## 2016-03-09 NOTE — ED Triage Notes (Signed)
Patient presents to the ED with neck pain and difficulty lifting her head.  Patient reports falling on Thursday and was seen by her PCP on Friday.  At that time patient did not have any obvious symptoms but patient began to have neck pain on Sunday.  Patient is unsure of how she fell or whether or not she passed out.

## 2016-11-06 ENCOUNTER — Ambulatory Visit (INDEPENDENT_AMBULATORY_CARE_PROVIDER_SITE_OTHER): Payer: Self-pay | Admitting: Vascular Surgery

## 2016-11-06 ENCOUNTER — Encounter (INDEPENDENT_AMBULATORY_CARE_PROVIDER_SITE_OTHER): Payer: Medicare Other

## 2016-11-16 ENCOUNTER — Encounter (INDEPENDENT_AMBULATORY_CARE_PROVIDER_SITE_OTHER): Payer: Medicare Other

## 2016-12-11 ENCOUNTER — Ambulatory Visit (INDEPENDENT_AMBULATORY_CARE_PROVIDER_SITE_OTHER): Payer: Medicare Other | Admitting: Vascular Surgery

## 2016-12-15 ENCOUNTER — Other Ambulatory Visit: Payer: Self-pay | Admitting: Internal Medicine

## 2016-12-15 DIAGNOSIS — R4182 Altered mental status, unspecified: Secondary | ICD-10-CM

## 2016-12-20 ENCOUNTER — Ambulatory Visit
Admission: RE | Admit: 2016-12-20 | Discharge: 2016-12-20 | Disposition: A | Discharge status: Home / Self Care | Payer: Medicare Other | Source: Ambulatory Visit | Attending: Internal Medicine | Admitting: Internal Medicine

## 2016-12-20 DIAGNOSIS — I739 Peripheral vascular disease, unspecified: Secondary | ICD-10-CM | POA: Insufficient documentation

## 2016-12-20 DIAGNOSIS — R4182 Altered mental status, unspecified: Secondary | ICD-10-CM | POA: Diagnosis present

## 2016-12-21 ENCOUNTER — Ambulatory Visit (INDEPENDENT_AMBULATORY_CARE_PROVIDER_SITE_OTHER): Payer: Medicare Other | Admitting: Vascular Surgery

## 2016-12-21 ENCOUNTER — Other Ambulatory Visit (INDEPENDENT_AMBULATORY_CARE_PROVIDER_SITE_OTHER): Payer: Self-pay | Admitting: Vascular Surgery

## 2016-12-21 ENCOUNTER — Other Ambulatory Visit (INDEPENDENT_AMBULATORY_CARE_PROVIDER_SITE_OTHER): Payer: Medicare Other

## 2016-12-21 ENCOUNTER — Encounter (INDEPENDENT_AMBULATORY_CARE_PROVIDER_SITE_OTHER): Payer: Self-pay | Admitting: Vascular Surgery

## 2016-12-21 DIAGNOSIS — I6523 Occlusion and stenosis of bilateral carotid arteries: Secondary | ICD-10-CM

## 2016-12-21 DIAGNOSIS — E1159 Type 2 diabetes mellitus with other circulatory complications: Secondary | ICD-10-CM | POA: Diagnosis not present

## 2016-12-21 DIAGNOSIS — E119 Type 2 diabetes mellitus without complications: Secondary | ICD-10-CM | POA: Insufficient documentation

## 2016-12-21 DIAGNOSIS — E785 Hyperlipidemia, unspecified: Secondary | ICD-10-CM | POA: Insufficient documentation

## 2016-12-21 DIAGNOSIS — E782 Mixed hyperlipidemia: Secondary | ICD-10-CM

## 2016-12-21 DIAGNOSIS — I10 Essential (primary) hypertension: Secondary | ICD-10-CM | POA: Insufficient documentation

## 2016-12-21 DIAGNOSIS — I6529 Occlusion and stenosis of unspecified carotid artery: Secondary | ICD-10-CM | POA: Insufficient documentation

## 2016-12-21 NOTE — Progress Notes (Signed)
MRN : [id number]  Beth Leon is a 83 y.o. (02-18-1928) female who presents with chief complaint of  Chief Complaint  Patient presents with  . Carotid    1 year f/u carotid check  .  History of Present Illness: The patient is seen for follow up evaluation of carotid stenosis. The carotid stenosis followed by ultrasound.   The patient denies amaurosis fugax. There is no recent history of TIA symptoms or focal motor deficits. There is no prior documented CVA.  The patient is taking enteric-coated aspirin 81 mg daily.  There is no history of migraine headaches. There is no history of seizures.  The patient has a history of coronary artery disease, no recent episodes of angina or shortness of breath. The patient denies PAD or claudication symptoms. There is a history of hyperlipidemia which is being treated with a statin.    Carotid Duplex done today shows <40% RICA and 60-79% LICA.  No change compared to last study in 11/04/2015  Current Meds  Medication Sig  . amLODipine (NORVASC) 10 MG tablet Take 10 mg by mouth daily.  Marland Kitchen aspirin EC 81 MG tablet Take 81 mg by mouth at bedtime.  . cyclobenzaprine (FLEXERIL) 10 MG tablet Take 1 tablet (10 mg total) by mouth at bedtime.  . docusate sodium (COLACE) 100 MG capsule Take 200 mg by mouth daily.  . furosemide (LASIX) 20 MG tablet Take by mouth.  . gabapentin (NEURONTIN) 300 MG capsule Take 300 mg by mouth 2 (two) times daily.  Marland Kitchen glimepiride (AMARYL) 1 MG tablet Take 1 mg by mouth daily with breakfast.  . lisinopril (PRINIVIL,ZESTRIL) 40 MG tablet Take 40 mg by mouth daily.  . Multiple Vitamin (MULTI-VITAMINS) TABS Take by mouth.  Marland Kitchen omeprazole (PRILOSEC) 20 MG capsule Take 20 mg by mouth daily.  . potassium chloride (K-DUR) 10 MEQ tablet Take by mouth.  . pravastatin (PRAVACHOL) 80 MG tablet Take 80 mg by mouth at bedtime.  Marland Kitchen tiZANidine (ZANAFLEX) 4 MG tablet 1/2 to 1 tab PO TID PRN back pain  . tolterodine (DETROL LA) 4 MG 24 hr  capsule Take 4 mg by mouth daily.  . traMADol (ULTRAM) 50 MG tablet Take 50 mg by mouth 2 (two) times daily.  . [DISCONTINUED] penicillin v potassium (VEETID) 500 MG tablet Take 1 tablet (500 mg total) by mouth 4 (four) times daily.    Past Medical History:  Diagnosis Date  . Diabetes mellitus without complication (HCC)   . High cholesterol   . Hypertension     Past Surgical History:  Procedure Laterality Date  . ABDOMINAL HYSTERECTOMY    . HERNIA REPAIR      Social History Social History  Substance Use Topics  . Smoking status: Former Games developer  . Smokeless tobacco: Never Used  . Alcohol use No    Family History No family history on file.  Allergies  Allergen Reactions  . Atorvastatin     Other reaction(s): Unknown  . Codeine Nausea And Vomiting  . Simvastatin     Other reaction(s): Unknown     REVIEW OF SYSTEMS (Negative unless checked)  Constitutional: Weight loss  Fever  Chills Cardiac: Chest pain   Chest pressure   Palpitations   Shortness of breath when laying flat   Shortness of breath with exertion. Vascular:  Pain in legs with walking   Pain in legs at rest  History of DVT   Phlebitis   Swelling in legs   Varicose veins     Non-healing ulcers Pulmonary:   Uses home oxygen   Productive cough   Hemoptysis   Wheeze  COPD   Asthma Neurologic:  Dizziness   Seizures   History of stroke   History of TIA  Aphasia   Vissual changes   Weakness or numbness in arm   Weakness or numbness in leg Musculoskeletal:   Joint swelling   Joint pain   Low back pain Hematologic:  Easy bruising  Easy bleeding   Hypercoagulable state   Anemic Gastrointestinal:  Diarrhea   Vomiting  Gastroesophageal reflux/heartburn   Difficulty swallowing. Genitourinary:  Chronic kidney disease   Difficult urination  Frequent urination   Blood in urine Skin:  Rashes   Ulcers  Psychological:  History of anxiety     History of major depression.  Physical Examination  Vitals:   12/21/16 0831 12/21/16 0832  BP: (!) 160/79 (!) 157/73  Pulse: (!) 51   Resp: 15   Weight: 74.4 kg (164 lb)   Height:  (1.626 m)    Body mass index is 28.15 kg/m. Gen: WD/WN, NAD Head: [place]/AT, No temporalis wasting.  Ear/Nose/Throat: Hearing grossly intact, nares w/o erythema or drainage Eyes: PER, EOMI, sclera nonicteric.  Neck: Supple, no large masses.   Pulmonary:  Good air movement, no audible wheezing bilaterally, no use of accessory muscles.  Cardiac: RRR, no JVD Vascular: left carotid bruit Vessel Right Left  Radial Palpable Palpable  Ulnar Palpable Palpable  Brachial Palpable Palpable  Carotid Palpable Palpable  Gastrointestinal: Non-distended. No guarding/no peritoneal signs.  Musculoskeletal: M/S 5/5 throughout.  No deformity or atrophy.  Neurologic: CN 2-12 intact. Symmetrical.  Speech is fluent. Motor exam as listed above. Psychiatric: Judgment intact, Mood & affect appropriate for pt's clinical situation. Dermatologic: No rashes or ulcers noted.  No changes consistent with cellulitis. Lymph : No lichenification or skin changes of chronic lymphedema.  CBC Lab Results  Component Value Date   WBC 10.2 03/09/2016   HGB 13.4 03/09/2016   HCT 39.4 03/09/2016   MCV 87.2 03/09/2016   PLT 199 03/09/2016    BMET    Component Value Date/Time   NA 135 03/09/2016 0931   K 4.1 03/09/2016 0931   CL 100 (L) 03/09/2016 0931   CO2 26 03/09/2016 0931   GLUCOSE 117 (H) 03/09/2016 0931   BUN 24 (H) 03/09/2016 0931   CREATININE 1.25 (H) 03/09/2016 0931   CALCIUM 10.1 03/09/2016 0931   GFRNONAA 38 (L) 03/09/2016 0931   GFRAA 44 (L) 03/09/2016 0931   CrCl cannot be calculated (Patient's most recent lab result is older than the maximum 21 days allowed.).  COAG No results found for: INR, PROTIME  Radiology Ct Head Wo Contrast  Result Date: 12/21/2016 CLINICAL DATA:  Dizziness with intermittent  altered mental status EXAM: CT HEAD WITHOUT CONTRAST TECHNIQUE: Contiguous axial images were obtained from the base of the skull through the vertex without intravenous contrast. COMPARISON:  March 09, 2016 FINDINGS: Brain: There is mild diffuse atrophy. There is no intracranial mass, hemorrhage, extra-axial fluid collection, or midline shift. There is patchy small vessel disease in the centra semiovale bilaterally. There are scattered lacunar infarcts throughout the centra semiovale bilaterally, stable. There is small vessel disease in each thalamus. No evident acute infarct. Vascular: There is no hyperdense vessel. There is calcification in each carotid siphon. Skull: Bony calvarium appears intact. Sinuses/Orbits: Visualized paranasal sinuses are clear. Note that the frontal sinuses are virtually aplastic. Visualized orbits appear symmetric bilaterally. Other: Mastoid air  cells are clear. IMPRESSION: Mild atrophy, stable. Patchy small vessel disease throughout the centra semiovale bilaterally and to a lesser extent each thalamus. Scattered lacunar infarcts in the periventricular white matter, stable. No acute infarct evident. No mass, hemorrhage, or extra-axial fluid collection. There are foci of arterial vascular calcification. Electronically Signed   By: Bretta Bang III M.D.   On: 12/21/2016 09:03     Assessment/Plan 1. Bilateral carotid artery stenosis Recommend:  Given the patient's asymptomatic subcritical stenosis no further invasive testing or surgery at this time.  Carotid Duplex done today shows <40% RICA and 60-79% LICA.  No change compared to last study in 11/04/2015  Continue antiplatelet therapy as prescribed Continue management of CAD, HTN and Hyperlipidemia Healthy heart diet,  encouraged exercise at least 4 times per week Follow up in 12 months with duplex ultrasound and physical exam based on >50% stenosis of the LICA carotid artery   - VAS US CAROTID; Future  2. Essential  hypertension Continue antihypertensive medications as already ordered, these medications have been reviewed and there are no changes at this time.   3. Type 2 diabetes mellitus with other circulatory complication, unspecified whether long term insulin use (HCC) Continue hypoglycemic medications as already ordered, these medications have been reviewed and there are no changes at this time.  Hgb A1C to be monitored as already arranged by primary service   4. Mixed hyperlipidemia Continue statin as ordered and reviewed, no changes at this time   Levora Dredge, MD  12/21/2016 12:37 PM

## 2017-03-06 ENCOUNTER — Ambulatory Visit
Admission: RE | Admit: 2017-03-06 | Discharge: 2017-03-06 | Disposition: A | Discharge status: Home / Self Care | Payer: Medicare Other | Source: Ambulatory Visit | Attending: Nephrology | Admitting: Nephrology

## 2017-03-06 ENCOUNTER — Other Ambulatory Visit: Payer: Self-pay | Admitting: Nephrology

## 2017-03-06 DIAGNOSIS — I700 Atherosclerosis of aorta: Secondary | ICD-10-CM | POA: Diagnosis not present

## 2017-04-05 ENCOUNTER — Other Ambulatory Visit: Payer: Self-pay | Admitting: Internal Medicine

## 2017-04-05 ENCOUNTER — Ambulatory Visit (HOSPITAL_COMMUNITY): Payer: Medicare Other

## 2017-04-05 ENCOUNTER — Ambulatory Visit
Admission: RE | Admit: 2017-04-05 | Discharge: 2017-04-05 | Disposition: A | Discharge status: Home / Self Care | Payer: Medicare Other | Source: Ambulatory Visit | Attending: Internal Medicine | Admitting: Internal Medicine

## 2017-04-05 DIAGNOSIS — R4189 Other symptoms and signs involving cognitive functions and awareness: Secondary | ICD-10-CM | POA: Insufficient documentation

## 2017-04-05 DIAGNOSIS — F0390 Unspecified dementia without behavioral disturbance: Secondary | ICD-10-CM | POA: Diagnosis present

## 2017-04-05 DIAGNOSIS — R410 Disorientation, unspecified: Secondary | ICD-10-CM | POA: Diagnosis present

## 2017-05-15 ENCOUNTER — Emergency Department
Admission: EM | Admit: 2017-05-15 | Discharge: 2017-05-15 | Disposition: A | Discharge status: Home / Self Care | Payer: Medicare Other | Attending: Emergency Medicine | Admitting: Emergency Medicine

## 2017-05-15 ENCOUNTER — Encounter: Payer: Self-pay | Admitting: Medical Oncology

## 2017-05-15 DIAGNOSIS — E7800 Pure hypercholesterolemia, unspecified: Secondary | ICD-10-CM | POA: Insufficient documentation

## 2017-05-15 DIAGNOSIS — Z7982 Long term (current) use of aspirin: Secondary | ICD-10-CM | POA: Insufficient documentation

## 2017-05-15 DIAGNOSIS — Z79899 Other long term (current) drug therapy: Secondary | ICD-10-CM | POA: Diagnosis not present

## 2017-05-15 DIAGNOSIS — F0391 Unspecified dementia with behavioral disturbance: Secondary | ICD-10-CM

## 2017-05-15 DIAGNOSIS — I10 Essential (primary) hypertension: Secondary | ICD-10-CM | POA: Diagnosis not present

## 2017-05-15 DIAGNOSIS — F911 Conduct disorder, childhood-onset type: Secondary | ICD-10-CM | POA: Diagnosis present

## 2017-05-15 DIAGNOSIS — R4689 Other symptoms and signs involving appearance and behavior: Secondary | ICD-10-CM

## 2017-05-15 DIAGNOSIS — E119 Type 2 diabetes mellitus without complications: Secondary | ICD-10-CM | POA: Diagnosis not present

## 2017-05-15 LAB — URINALYSIS, COMPLETE (UACMP) WITH MICROSCOPIC
BILIRUBIN URINE: NEGATIVE
GLUCOSE, UA: NEGATIVE mg/dL
HGB URINE DIPSTICK: NEGATIVE
KETONES UR: NEGATIVE mg/dL
LEUKOCYTES UA: NEGATIVE
NITRITE: NEGATIVE
PH: 7 (ref 5.0–8.0)
PROTEIN: NEGATIVE mg/dL
Specific Gravity, Urine: 1.008 (ref 1.005–1.030)

## 2017-05-15 LAB — COMPREHENSIVE METABOLIC PANEL
ALBUMIN: 3.8 g/dL (ref 3.5–5.0)
ALK PHOS: 64 U/L (ref 38–126)
ALT: 15 U/L (ref 14–54)
ANION GAP: 10 (ref 5–15)
AST: 28 U/L (ref 15–41)
BILIRUBIN TOTAL: 0.8 mg/dL (ref 0.3–1.2)
BUN: 21 mg/dL — ABNORMAL HIGH (ref 6–20)
CALCIUM: 10.4 mg/dL — AB (ref 8.9–10.3)
CO2: 27 mmol/L (ref 22–32)
Chloride: 104 mmol/L (ref 101–111)
Creatinine, Ser: 1.29 mg/dL — ABNORMAL HIGH (ref 0.44–1.00)
GFR calc non Af Amer: 36 mL/min — ABNORMAL LOW (ref >60)
GFR, EST AFRICAN AMERICAN: 42 mL/min — AB (ref >60)
GLUCOSE: 132 mg/dL — AB (ref 65–99)
Potassium: 3.8 mmol/L (ref 3.5–5.1)
SODIUM: 141 mmol/L (ref 135–145)
TOTAL PROTEIN: 6.9 g/dL (ref 6.5–8.1)

## 2017-05-15 LAB — CBC
HEMATOCRIT: 40.3 % (ref 35.0–47.0)
HEMOGLOBIN: 13.1 g/dL (ref 12.0–16.0)
MCH: 28.7 pg (ref 26.0–34.0)
MCHC: 32.4 g/dL (ref 32.0–36.0)
MCV: 88.5 fL (ref 80.0–100.0)
Platelets: 173 10*3/uL (ref 150–440)
RBC: 4.55 MIL/uL (ref 3.80–5.20)
RDW: 14.8 % — ABNORMAL HIGH (ref 11.5–14.5)
WBC: 7.6 10*3/uL (ref 3.6–11.0)

## 2017-05-15 MED ORDER — LORAZEPAM 1 MG PO TABS
ORAL_TABLET | ORAL | Status: AC
  Filled 2017-05-15: qty 1

## 2017-05-15 MED ORDER — QUETIAPINE FUMARATE 25 MG PO TABS: 25.0000 mg | ORAL_TABLET | Freq: Four times a day (QID) | ORAL | 0 refills | Status: DC | PRN

## 2017-05-15 MED ORDER — QUETIAPINE FUMARATE 25 MG PO TABS: 25.0000 mg | ORAL_TABLET | Freq: Four times a day (QID) | ORAL | Status: DC | PRN

## 2017-05-15 MED ORDER — QUETIAPINE FUMARATE 25 MG PO TABS
50.0000 mg | ORAL_TABLET | Freq: Two times a day (BID) | ORAL | Status: DC
  Administered 2017-05-15: 50 mg via ORAL

## 2017-05-15 MED ORDER — QUETIAPINE FUMARATE 25 MG PO TABS
ORAL_TABLET | ORAL | Status: AC
  Filled 2017-05-15: qty 2

## 2017-05-15 MED ORDER — LORAZEPAM 1 MG PO TABS: 1.0000 mg | ORAL_TABLET | Freq: Once | ORAL | Status: DC

## 2017-05-15 MED ORDER — HALOPERIDOL LACTATE 5 MG/ML IJ SOLN
INTRAMUSCULAR | Status: AC
  Administered 2017-05-15: 5 mg via INTRAMUSCULAR
  Filled 2017-05-15: qty 1

## 2017-05-15 MED ORDER — QUETIAPINE FUMARATE 25 MG PO TABS
25.0000 mg | ORAL_TABLET | Freq: Once | ORAL | Status: AC
  Administered 2017-05-15: 25 mg via ORAL
  Filled 2017-05-15: qty 1

## 2017-05-15 MED ORDER — QUETIAPINE FUMARATE 50 MG PO TABS: 50.0000 mg | ORAL_TABLET | Freq: Two times a day (BID) | ORAL | 0 refills | Status: DC

## 2017-05-15 MED ORDER — HALOPERIDOL LACTATE 5 MG/ML IJ SOLN
5.0000 mg | Freq: Once | INTRAMUSCULAR | Status: AC
  Administered 2017-05-15: 5 mg via INTRAMUSCULAR

## 2017-05-15 MED ORDER — QUETIAPINE FUMARATE 25 MG PO TABS: 25.0000 mg | ORAL_TABLET | Freq: Every day | ORAL | Status: DC

## 2017-05-15 NOTE — ED Notes (Signed)
BEHAVIORAL HEALTH ROUNDING Patient sleeping: No. Patient alert and oriented: yes Behavior appropriate: Yes.  ; If no, describe:  Nutrition and fluids offered: yes Toileting and hygiene offered: Yes  Sitter present: q15 minute observations and security  monitoring Law enforcement present: Yes  ODS  

## 2017-05-15 NOTE — ED Notes (Signed)
[facility] [place] [facility] [place] and updated on medications. Kendal HymenBonnie verbalized that she disagreed with treatment plan but verbalized that she would accept pt back at [facility] if it was documented that the doctor believed the pt was safe to be discharged. MD updated and has added to discharge papers. RN to update family at this time.

## 2017-05-15 NOTE — ED Triage Notes (Signed)
[facility]. Pt was being aggressive with staff at facility holding a hole puncher attempting to bust out a window. Pt has dementia. Has refused to take her meds at the facility.

## 2017-05-15 NOTE — ED Provider Notes (Signed)
[facility] Emergency Department Provider Note   ____________________________________________    I have reviewed the triage vital signs and the nursing notes.   HISTORY  Chief Complaint Psychiatric Evaluation   Patient has a history of dementia, history as per daughter  HPI Beth Leon is a 84 y.o. female brought in by EMS for psychiatric evaluation.  Patient comes from nursing home where apparently this morning she was attempting to break the window with a hole punch as well as lashing out at staff.  Daughter reports that the patient has had severe sundowning in the past and they recently started Seroquel last week and recently went to twice a day dosing.  Currently patient is calm and in no distress   Past Medical History:  Diagnosis Date  . Diabetes mellitus without complication (HCC)   . High cholesterol   . Hypertension     Patient Active Problem List   Diagnosis Date Noted  . Carotid stenosis 12/21/2016  . Essential hypertension 12/21/2016  . Diabetes (HCC) 12/21/2016  . Hyperlipidemia 12/21/2016    Past Surgical History:  Procedure Laterality Date  . ABDOMINAL HYSTERECTOMY    . HERNIA REPAIR      Prior to Admission medications   Medication Sig Start Date End Date Taking? Authorizing Provider  acetaminophen (TYLENOL) 500 MG tablet Take 100 mg by mouth 2 (two) times daily.   Yes [provider]  aspirin EC 81 MG tablet Take 81 mg by mouth at bedtime.   Yes [provider]  busPIRone (BUSPAR) 30 MG tablet Take 30 mg by mouth 2 (two) times daily. 05/11/17  Yes [provider]  cyanocobalamin 1000 MCG tablet Take 500 mcg by mouth daily.   Yes [provider]  furosemide (LASIX) 20 MG tablet Take 40 mg by mouth daily as needed for edema.  07/05/16 07/05/17 Yes [provider]  gabapentin (NEURONTIN) 300 MG capsule Take 300 mg by mouth 2 (two) times daily.   Yes [provider]  lisinopril  (PRINIVIL,ZESTRIL) 40 MG tablet Take 40 mg by mouth daily.   Yes [provider]  Melatonin 5 MG TABS Take 5 mg by mouth at bedtime.   Yes [provider]  mirtazapine (REMERON) 30 MG tablet Take 30 mg by mouth at bedtime.   Yes [provider]  omeprazole (PRILOSEC) 20 MG capsule Take 20 mg by mouth daily.   Yes [provider]  potassium chloride (K-DUR) 10 MEQ tablet Take 10 mEq by mouth daily as needed.  07/05/16 07/05/17 Yes [provider]  pravastatin (PRAVACHOL) 80 MG tablet Take 80 mg by mouth daily.    Yes [provider]  traMADol (ULTRAM) 50 MG tablet Take 50 mg by mouth every 8 (eight) hours as needed for moderate pain.    Yes [provider]  cyclobenzaprine (FLEXERIL) 10 MG tablet Take 1 tablet (10 mg total) by mouth at bedtime. Patient not taking: Reported on 05/15/2017 03/09/16   Willy Eddy, MD     Allergies Atorvastatin; Codeine; and Simvastatin  No family history on file.  Social History Social History   Tobacco Use  . Smoking status: Former Games developer  . Smokeless tobacco: Never Used  Substance Use Topics  . Alcohol use: No  . Drug use: Not on file    Level 5 caveat: Unable to obtain completely accurate review of Systems due to dementia  Constitutional: No reports of fever Eyes: Patient denies vision changes ENT: No sore  throat. Cardiovascular: Denies chest pain. Respiratory: no reports of cough Gastrointestinal: no vomiting Genitourinary: no reports of foul smelling urine Musculoskeletal: Negative for joint swelling Skin: Negative for rash. Neurological: Negative for new weakness   ____________________________________________   PHYSICAL EXAM:  VITAL SIGNS: ED Triage Vitals  Enc Vitals Group     BP 05/15/17 0903 (!) 191/77     Pulse Rate 05/15/17 0903 70     Resp 05/15/17 0903 19     Temp 05/15/17 0903 98.4 F (36.9 C)     Temp Source 05/15/17 0903 Oral     SpO2 05/15/17 0903 96 %      Weight 05/15/17 0904 70.3 kg (155 lb)     Height 05/15/17 0904 1.702 m (5\' 7" )     Head Circumference --      Peak Flow --      Pain Score --      Pain Loc --      Pain Edu? --      Excl. in GC? --     Constitutional: Alert and calm. No acute distress.  Eyes: Conjunctivae are normal.  Head: Atraumatic. Nose: No congestion/rhinnorhea. Mouth/Throat: Mucous membranes are moist.   Neck:  Painless ROM Cardiovascular: Normal rate, regular rhythm. Grossly normal heart sounds.  Good peripheral circulation. Respiratory: Normal respiratory effort.  No retractions. Lungs CTAB. Gastrointestinal: Soft and nontender. No distention.   Genitourinary: deferred Musculoskeletal:  Warm and well perfused Neurologic:  Normal speech and language. No gross focal neurologic deficits are appreciated.  Skin:  Skin is warm, dry and intact. No rash noted. Psychiatric: Mood and affect are normal. Speech and behavior are normal.  ____________________________________________   LABS (all labs ordered are listed, but only abnormal results are displayed)  Labs Reviewed  CBC - Abnormal; Notable for the following components:      Result Value   RDW 14.8 (*)    All other components within normal limits  COMPREHENSIVE METABOLIC PANEL - Abnormal; Notable for the following components:   Glucose, Bld 132 (*)    BUN 21 (*)    Creatinine, Ser 1.29 (*)    Calcium 10.4 (*)    GFR calc non Af Amer 36 (*)    GFR calc Af Amer 42 (*)    All other components within normal limits  URINALYSIS, COMPLETE (UACMP) WITH MICROSCOPIC   ____________________________________________  EKG  none ____________________________________________  RADIOLOGY   ____________________________________________   PROCEDURES  Procedure(s) performed: No  Procedures   Critical Care performed: No ____________________________________________   INITIAL IMPRESSION / ASSESSMENT AND PLAN / ED COURSE  Pertinent labs & imaging  results that were available during my care of the patient were reviewed by me and considered in my medical decision making (see chart for details).  Patient well-appearing in no acute distress.  She is calm here in the emergency department.  No aggression or acting out.  She has a history of dementia with behavioral disturbance.  Will give morning dose of Seroquel, check labs urine and have psychiatry see her    ____________________________________________   FINAL CLINICAL IMPRESSION(S) / ED DIAGNOSES  Final diagnoses:  Dementia with behavioral disturbance, unspecified dementia type  Aggressive behavior        Note:  This document was prepared using Dragon voice recognition software and may include unintentional dictation errors.    Jene EveryKinner, Amayrany Cafaro, MD 05/15/17 1430

## 2017-05-15 NOTE — ED Notes (Signed)
MD at bedside to talk to family

## 2017-05-15 NOTE — ED Notes (Signed)
RN able to talk to family and explain as best as possible the situation. pT is verbalizing that she is ready to go and is in bathroom dressing self with 1 stand by assist. Family has agreed they will transport pt. Pt continues to have negative verbalizations but is following commands and is able to take medications.
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
## 2017-05-15 NOTE — Discharge Instructions (Signed)
Take the Seroquel at the increased dose as prescribed. Please return for any further problems. Please follow-up with her regular doctor.  Patient has been seen by psychiatry does not feel she presents a threat to herself or anyone else. Psychiatry feels the increased dose of Seroquel and proper use of the escalating techniques should maintain everyone's safety.

## 2017-05-15 NOTE — Consult Note (Signed)
Psychiatry: Consult done.  Chart reviewed.  Spoke with daughter.  Patient with dementia and agitation.  Recently started on Seroquel.  Patient has been taken off of IVC and can go back to the home place if we can make some medicine adjustment.  Recommend increasing Seroquel to 50 mg twice a day standing with a 25 mg every 6 hours as needed dose as well.  Orders changed.  Prescriptions all printed out.  Case reviewed with TTS and ER physician.  Full consult to follow.

## 2017-05-15 NOTE — ED Notes (Addendum)
Pt tried to go to bathroom with family member. Pt urinated in the floor and on herself. This tech and CN got pt back in the bed and changed pt scrubs and diaper. Pt not happy and thinks we are all trying to hurt her.

## 2017-05-15 NOTE — ED Notes (Signed)
Pt left the ED at 1800. Delay in charting.

## 2017-05-15 NOTE — ED Notes (Signed)
Pt given warm blanket. Pt stating "I just don't know why I'm here". Pt told she is waiting to talk to doctor.

## 2017-05-15 NOTE — ED Notes (Signed)
Pt sitting up in bed and attempting to climb out. Pt noticeably angry and anxious RN attempted to talk pt down or affer PO ativan vs IM shot. Pt continues to talk in circles about how daughter had betrayed her and everyone is trying to hurt her and that she does not have a choice anyway. RN has administered medication and pt is angry and reported, "I wish I knew if that was legal. RN attempted to explain medication to pt but pt would not allow for this. Pt continues to talk about daughters "doing this to her" and staff hurting her.

## 2017-06-21 ENCOUNTER — Emergency Department: Payer: Medicare Other

## 2017-06-21 ENCOUNTER — Inpatient Hospital Stay
Admission: EM | Admit: 2017-06-21 | Discharge: 2017-07-01 | DRG: 071 | Disposition: A | Discharge status: Hospice | Payer: Medicare Other | Attending: Family Medicine | Admitting: Family Medicine

## 2017-06-21 ENCOUNTER — Encounter: Payer: Self-pay | Admitting: Internal Medicine

## 2017-06-21 DIAGNOSIS — Z66 Do not resuscitate: Secondary | ICD-10-CM | POA: Diagnosis present

## 2017-06-21 DIAGNOSIS — Z7189 Other specified counseling: Secondary | ICD-10-CM

## 2017-06-21 DIAGNOSIS — F02818 Dementia in other diseases classified elsewhere, unspecified severity, with other behavioral disturbance: Secondary | ICD-10-CM

## 2017-06-21 DIAGNOSIS — N179 Acute kidney failure, unspecified: Secondary | ICD-10-CM | POA: Diagnosis present

## 2017-06-21 DIAGNOSIS — I160 Hypertensive urgency: Secondary | ICD-10-CM | POA: Diagnosis present

## 2017-06-21 DIAGNOSIS — Z8349 Family history of other endocrine, nutritional and metabolic diseases: Secondary | ICD-10-CM

## 2017-06-21 DIAGNOSIS — G9349 Other encephalopathy: Secondary | ICD-10-CM | POA: Diagnosis not present

## 2017-06-21 DIAGNOSIS — F0281 Dementia in other diseases classified elsewhere with behavioral disturbance: Secondary | ICD-10-CM | POA: Diagnosis present

## 2017-06-21 DIAGNOSIS — Z885 Allergy status to narcotic agent status: Secondary | ICD-10-CM

## 2017-06-21 DIAGNOSIS — R011 Cardiac murmur, unspecified: Secondary | ICD-10-CM | POA: Diagnosis present

## 2017-06-21 DIAGNOSIS — Z87891 Personal history of nicotine dependence: Secondary | ICD-10-CM

## 2017-06-21 DIAGNOSIS — I129 Hypertensive chronic kidney disease with stage 1 through stage 4 chronic kidney disease, or unspecified chronic kidney disease: Secondary | ICD-10-CM | POA: Diagnosis present

## 2017-06-21 DIAGNOSIS — R410 Disorientation, unspecified: Secondary | ICD-10-CM

## 2017-06-21 DIAGNOSIS — R4182 Altered mental status, unspecified: Secondary | ICD-10-CM

## 2017-06-21 DIAGNOSIS — I6529 Occlusion and stenosis of unspecified carotid artery: Secondary | ICD-10-CM | POA: Diagnosis present

## 2017-06-21 DIAGNOSIS — R296 Repeated falls: Secondary | ICD-10-CM | POA: Diagnosis present

## 2017-06-21 DIAGNOSIS — Z79899 Other long term (current) drug therapy: Secondary | ICD-10-CM

## 2017-06-21 DIAGNOSIS — F419 Anxiety disorder, unspecified: Secondary | ICD-10-CM | POA: Diagnosis present

## 2017-06-21 DIAGNOSIS — I959 Hypotension, unspecified: Secondary | ICD-10-CM | POA: Diagnosis present

## 2017-06-21 DIAGNOSIS — I440 Atrioventricular block, first degree: Secondary | ICD-10-CM | POA: Diagnosis present

## 2017-06-21 DIAGNOSIS — G9340 Encephalopathy, unspecified: Secondary | ICD-10-CM | POA: Diagnosis present

## 2017-06-21 DIAGNOSIS — Z515 Encounter for palliative care: Secondary | ICD-10-CM

## 2017-06-21 DIAGNOSIS — E876 Hypokalemia: Secondary | ICD-10-CM | POA: Diagnosis present

## 2017-06-21 DIAGNOSIS — Z79891 Long term (current) use of opiate analgesic: Secondary | ICD-10-CM

## 2017-06-21 DIAGNOSIS — N183 Chronic kidney disease, stage 3 unspecified: Secondary | ICD-10-CM | POA: Diagnosis present

## 2017-06-21 DIAGNOSIS — Z8249 Family history of ischemic heart disease and other diseases of the circulatory system: Secondary | ICD-10-CM

## 2017-06-21 DIAGNOSIS — E7800 Pure hypercholesterolemia, unspecified: Secondary | ICD-10-CM | POA: Diagnosis present

## 2017-06-21 DIAGNOSIS — G301 Alzheimer's disease with late onset: Secondary | ICD-10-CM | POA: Diagnosis present

## 2017-06-21 DIAGNOSIS — E11649 Type 2 diabetes mellitus with hypoglycemia without coma: Secondary | ICD-10-CM | POA: Diagnosis not present

## 2017-06-21 DIAGNOSIS — R451 Restlessness and agitation: Secondary | ICD-10-CM | POA: Diagnosis present

## 2017-06-21 DIAGNOSIS — E1142 Type 2 diabetes mellitus with diabetic polyneuropathy: Secondary | ICD-10-CM | POA: Diagnosis present

## 2017-06-21 DIAGNOSIS — Z90710 Acquired absence of both cervix and uterus: Secondary | ICD-10-CM

## 2017-06-21 DIAGNOSIS — Z888 Allergy status to other drugs, medicaments and biological substances status: Secondary | ICD-10-CM

## 2017-06-21 DIAGNOSIS — E119 Type 2 diabetes mellitus without complications: Secondary | ICD-10-CM

## 2017-06-21 DIAGNOSIS — E1122 Type 2 diabetes mellitus with diabetic chronic kidney disease: Secondary | ICD-10-CM | POA: Diagnosis present

## 2017-06-21 DIAGNOSIS — F05 Delirium due to known physiological condition: Secondary | ICD-10-CM | POA: Diagnosis present

## 2017-06-21 DIAGNOSIS — I10 Essential (primary) hypertension: Secondary | ICD-10-CM | POA: Diagnosis present

## 2017-06-21 DIAGNOSIS — F0150 Vascular dementia without behavioral disturbance: Secondary | ICD-10-CM | POA: Diagnosis present

## 2017-06-21 DIAGNOSIS — E860 Dehydration: Secondary | ICD-10-CM | POA: Diagnosis present

## 2017-06-21 DIAGNOSIS — R197 Diarrhea, unspecified: Secondary | ICD-10-CM | POA: Diagnosis present

## 2017-06-21 DIAGNOSIS — R05 Cough: Secondary | ICD-10-CM | POA: Diagnosis not present

## 2017-06-21 DIAGNOSIS — E785 Hyperlipidemia, unspecified: Secondary | ICD-10-CM | POA: Diagnosis present

## 2017-06-21 DIAGNOSIS — N189 Chronic kidney disease, unspecified: Secondary | ICD-10-CM

## 2017-06-21 DIAGNOSIS — Z7982 Long term (current) use of aspirin: Secondary | ICD-10-CM

## 2017-06-21 LAB — URINALYSIS, COMPLETE (UACMP) WITH MICROSCOPIC
BACTERIA UA: NONE SEEN
BILIRUBIN URINE: NEGATIVE
Glucose, UA: NEGATIVE mg/dL
HGB URINE DIPSTICK: NEGATIVE
Ketones, ur: NEGATIVE mg/dL
LEUKOCYTES UA: NEGATIVE
Nitrite: NEGATIVE
PROTEIN: NEGATIVE mg/dL
Specific Gravity, Urine: 1.031 — ABNORMAL HIGH (ref 1.005–1.030)
pH: 5 (ref 5.0–8.0)

## 2017-06-21 LAB — COMPREHENSIVE METABOLIC PANEL
ALBUMIN: 3.2 g/dL — AB (ref 3.5–5.0)
ALK PHOS: 64 U/L (ref 38–126)
ALT: 21 U/L (ref 14–54)
ANION GAP: 8 (ref 5–15)
AST: 48 U/L — AB (ref 15–41)
BUN: 29 mg/dL — ABNORMAL HIGH (ref 6–20)
CALCIUM: 8.8 mg/dL — AB (ref 8.9–10.3)
CO2: 25 mmol/L (ref 22–32)
Chloride: 102 mmol/L (ref 101–111)
Creatinine, Ser: 1.61 mg/dL — ABNORMAL HIGH (ref 0.44–1.00)
GFR calc Af Amer: 32 mL/min — ABNORMAL LOW (ref >60)
GFR calc non Af Amer: 28 mL/min — ABNORMAL LOW (ref >60)
GLUCOSE: 105 mg/dL — AB (ref 65–99)
Potassium: 3.6 mmol/L (ref 3.5–5.1)
SODIUM: 135 mmol/L (ref 135–145)
Total Bilirubin: 0.7 mg/dL (ref 0.3–1.2)
Total Protein: 5.9 g/dL — ABNORMAL LOW (ref 6.5–8.1)

## 2017-06-21 LAB — CBC WITH DIFFERENTIAL/PLATELET
BASOS ABS: 0 10*3/uL (ref 0–0.1)
BASOS PCT: 1 %
Eosinophils Absolute: 0.1 10*3/uL (ref 0–0.7)
Eosinophils Relative: 2 %
HCT: 34 % — ABNORMAL LOW (ref 35.0–47.0)
HEMOGLOBIN: 11.1 g/dL — AB (ref 12.0–16.0)
Lymphocytes Relative: 15 %
Lymphs Abs: 0.8 10*3/uL — ABNORMAL LOW (ref 1.0–3.6)
MCH: 30.9 pg (ref 26.0–34.0)
MCHC: 32.6 g/dL (ref 32.0–36.0)
MCV: 95 fL (ref 80.0–100.0)
Monocytes Absolute: 0.7 10*3/uL (ref 0.2–0.9)
Monocytes Relative: 13 %
NEUTROS PCT: 69 %
Neutro Abs: 3.9 10*3/uL (ref 1.4–6.5)
Platelets: 119 10*3/uL — ABNORMAL LOW (ref 150–440)
RBC: 3.58 MIL/uL — AB (ref 3.80–5.20)
RDW: 16.5 % — ABNORMAL HIGH (ref 11.5–14.5)
WBC: 5.6 10*3/uL (ref 3.6–11.0)

## 2017-06-21 LAB — TROPONIN I: TROPONIN I: 0.03 ng/mL — AB (ref <0.03)

## 2017-06-21 MED ORDER — SODIUM CHLORIDE 0.9 % IV BOLUS (SEPSIS)
1000.0000 mL | Freq: Once | INTRAVENOUS | Status: AC
  Administered 2017-06-21: 1000 mL via INTRAVENOUS

## 2017-06-21 NOTE — ED Provider Notes (Signed)
[facility] Emergency Department Provider Note  ____________________________________________  Time seen: Approximately 8:44 PM  I have reviewed the triage vital signs and the nursing notes.   HISTORY  Chief Complaint Altered Mental Status  Level 5 caveat:  Portions of the history and physical were unable to be obtained due to ams   HPI Beth Leon is a 84 y.o. female with a history of dementia, diabetes, hypertension, hyperlipidemia who presents for evaluation of altered mental status.  History is gathered mostly from patient's daughter who was at the bedside.  Daughter reports the patient had a very active day.  Around 6:15 this evening she received her nightly meds which include a higher dose of lamictal started yesterday, seroquel, gabapentin, and buspar. Patient was still in the dinning hall when daughter arrived to see her. Patient was at baseline talking to daughter sitting on her walker. The patient then started to fall asleep and was hard to arouse her.  The daughter reports that patient usually becomes very difficult to arouse after taking her medications and is usually pretty hard to arouse her when she is asleep as well.  The daughter was not worried about the nursing staff was and wanted EMS to come and check on patient.  When EMS arrived the patient was satting 88% on room air. Vitals were otherwise wnl. No recent trauma. Patient has had diarrhea for a few days.  Past Medical History:  Diagnosis Date  . Diabetes mellitus without complication ([place])   . High cholesterol   . Hypertension     Patient Active Problem List   Diagnosis Date Noted  . Carotid stenosis 12/21/2016  . Essential hypertension 12/21/2016  . Diabetes ([place]) 12/21/2016  . Hyperlipidemia 12/21/2016    Past Surgical History:  Procedure Laterality Date  . ABDOMINAL HYSTERECTOMY    . HERNIA REPAIR      Prior to Admission medications   Medication Sig Start Date End Date  Taking? Authorizing Provider  acetaminophen (TYLENOL) 500 MG tablet Take 100 mg by mouth 2 (two) times daily.    [provider]  aspirin EC 81 MG tablet Take 81 mg by mouth at bedtime.    [provider]  busPIRone (BUSPAR) 30 MG tablet Take 30 mg by mouth 2 (two) times daily. 05/11/17   [provider]  cyanocobalamin 1000 MCG tablet Take 500 mcg by mouth daily.    [provider]  cyclobenzaprine (FLEXERIL) 10 MG tablet Take 1 tablet (10 mg total) by mouth at bedtime. Patient not taking: Reported on 05/15/2017 03/09/16   Merlyn Lot, MD  furosemide (LASIX) 20 MG tablet Take 40 mg by mouth daily as needed for edema.  07/05/16 07/05/17  [provider]  gabapentin (NEURONTIN) 300 MG capsule Take 300 mg by mouth 2 (two) times daily.    [provider]  lisinopril (PRINIVIL,ZESTRIL) 40 MG tablet Take 40 mg by mouth daily.    [provider]  Melatonin 5 MG TABS Take 5 mg by mouth at bedtime.    [provider]  mirtazapine (REMERON) 30 MG tablet Take 30 mg by mouth at bedtime.    [provider]  omeprazole (PRILOSEC) 20 MG capsule Take 20 mg by mouth daily.    [provider]  potassium chloride (K-DUR) 10 MEQ tablet Take 10 mEq by mouth daily as needed.  07/05/16 07/05/17  [provider]  pravastatin (PRAVACHOL) 80 MG tablet Take 80 mg by mouth daily.  [provider]  QUEtiapine (SEROQUEL) 25 MG tablet Take 1 tablet (25 mg total) by mouth every 6 (six) hours as needed (agitation). 05/15/17   Clapacs, Madie Reno, MD  QUEtiapine (SEROQUEL) 50 MG tablet Take 1 tablet (50 mg total) by mouth 2 (two) times daily. 05/15/17   Clapacs, Madie Reno, MD  traMADol (ULTRAM) 50 MG tablet Take 50 mg by mouth every 8 (eight) hours as needed for moderate pain.     [provider]    Allergies Atorvastatin; Codeine; and Simvastatin  No family history on file.  Social History Social History   Tobacco  Use  . Smoking status: Former Research scientist (life sciences)  . Smokeless tobacco: Never Used  Substance Use Topics  . Alcohol use: No  . Drug use: Not on file    Review of Systems  Constitutional: Negative for fever. + ASM Cardiovascular: Negative for chest pain. Respiratory: Negative for shortness of breath. Gastrointestinal: Negative for abdominal pain, vomiting or diarrhea. Genitourinary: Negative for dysuria.   Level 5 caveat:  Portions of the history and physical were unable to be obtained due to ams  ____________________________________________   PHYSICAL EXAM:  VITAL SIGNS: ED Triage Vitals  Enc Vitals Group     BP 06/21/17 1956 (!) 120/52     Pulse Rate 06/21/17 1956 90     Resp 06/21/17 1956 19     Temp 06/21/17 1956 98.1 F (36.7 C)     Temp Source 06/21/17 1956 Oral     SpO2 06/21/17 1956 100 %     Weight 06/21/17 1957 150 lb (68 kg)     Height 06/21/17 1957 '5\' 7"'$  (1.702 m)     Head Circumference --      Peak Flow --      Pain Score --      Pain Loc --      Pain Edu? --      Excl. in [place]? --     Constitutional: Patient is sleeping, arousable to her name, answer simple questions such as her name and where she is, follows simple commands, reports feeling very sleepy  HEENT:      Head: Normocephalic and atraumatic.         Eyes: Conjunctivae are normal. Sclera is non-icteric. PERRL      Mouth/Throat: Mucous membranes are dry.       Neck: Supple with no signs of meningismus. Cardiovascular: Regular rate and rhythm. No murmurs, gallops, or rubs. 2+ symmetrical distal pulses are present in all extremities. No JVD. Respiratory: Normal respiratory effort. Lungs are clear to auscultation bilaterally. No wheezes, crackles, or rhonchi.  Gastrointestinal: Soft, non tender, and non distended with positive bowel sounds. No rebound or guarding. Musculoskeletal: Nontender with normal range of motion in all extremities. No edema, cyanosis, or erythema of extremities. Neurologic: Face  symmetric, EOMI, PERRL intact strength x 4 Skin: Skin is warm, dry and intact. No rash noted.   ____________________________________________   LABS (all labs ordered are listed, but only abnormal results are displayed)  Labs Reviewed  CBC WITH DIFFERENTIAL/PLATELET - Abnormal; Notable for the following components:      Result Value   RBC 3.58 (*)    Hemoglobin 11.1 (*)    HCT 34.0 (*)    RDW 16.5 (*)    Platelets 119 (*)    Lymphs Abs 0.8 (*)    All other components within normal limits  COMPREHENSIVE METABOLIC PANEL - Abnormal; Notable for the following components:   Glucose, Bld 105 (*)  BUN 29 (*)    Creatinine, Ser 1.61 (*)    Calcium 8.8 (*)    Total Protein 5.9 (*)    Albumin 3.2 (*)    AST 48 (*)    GFR calc non Af Amer 28 (*)    GFR calc Af Amer 32 (*)    All other components within normal limits  URINALYSIS, COMPLETE (UACMP) WITH MICROSCOPIC - Abnormal; Notable for the following components:   Color, Urine AMBER (*)    APPearance CLEAR (*)    Specific Gravity, Urine 1.031 (*)    Squamous Epithelial / LPF 0-5 (*)    All other components within normal limits  TROPONIN I - Abnormal; Notable for the following components:   Troponin I 0.03 (*)    All other components within normal limits  TROPONIN I   ____________________________________________  EKG  ED ECG REPORT I, Rudene Re, the attending physician, personally viewed and interpreted this ECG.  Normal sinus rhythm, rate of 60, first-degree AV block, normal QRS and QTC, normal axis, no ST elevations or depressions. ____________________________________________  RADIOLOGY  I have personally reviewed the images performed during this visit and I agree with the Radiologist's read.   Interpretation by Radiologist:  Ct Head Wo Contrast  Result Date: 06/21/2017 CLINICAL DATA:  Altered mental status.  Dementia. EXAM: CT HEAD WITHOUT CONTRAST TECHNIQUE: Contiguous axial images were obtained from the  base of the skull through the vertex without intravenous contrast. COMPARISON:  04/05/2017. FINDINGS: Brain: No evidence for acute infarction, hemorrhage, mass lesion, hydrocephalus, or extra-axial fluid. Generalized atrophy. Chronic microvascular ischemic change throughout the white matter. Chronic LEFT parietal infarct. Vascular: Calcification of the cavernous internal carotid arteries consistent with cerebrovascular atherosclerotic disease. No signs of intracranial large vessel occlusion. Skull: Normal. Negative for fracture or focal lesion. Sinuses/Orbits: No acute finding. Other: None. IMPRESSION: Stable exam. Atrophy and small vessel disease. No acute intracranial abnormality is evident. Electronically Signed   By: Staci Righter M.D.   On: 06/21/2017 20:35   ____________________________________________   PROCEDURES  Procedure(s) performed: None Procedures Critical Care performed:  None ____________________________________________   INITIAL IMPRESSION / ASSESSMENT AND PLAN / ED COURSE  84 y.o. female with a history of dementia, diabetes, hypertension, hyperlipidemia who presents for evaluation of altered mental status.  Patient became very somnolent and difficult to arouse after taking her evening meds.  She is on a lot of sedating medications and also has had her Lamictal increased from 25-50 mg at bedtime.  This evening was the second evening she received the new dose.  No trauma.  No recent illnesses other than diarrhea for the last few days.  Patient does look sedated to me on exam, she is grossly neurologically intact.  She has no complaints at this time.  We will check a head CT and basic labs including urinalysis to rule out dehydration, UTI, intracranial hemorrhage, electrolyte abnormalities.  At this time believe patient presentation is due to medication sedation.     _________________________ 11:14 PM on 06/21/2017 -----------------------------------------  Labs consistent with a  mild bump in patient's creatinine.  CT head is negative.  UA is negative. Troponin is borderline with non ischemic EKG. 2nd troponin is due 11:50 PM. hgb is 11.1 and it was 13.1 one month ago.  According to patient's daughter she had several episodes of bloody diarrhea last week.  Rectal exam was done which shows brown stool guaiac negative.  Patient given IV fluid for acute on chronic kidney injury and will  be admitted to the hospitalist service.   As part of my medical decision making, I reviewed the following data within the [place] notes reviewed and incorporated, Labs reviewed , EKG interpreted , Radiograph reviewed , Discussed with admitting physician , Notes from prior ED visits and [place] Controlled Substance Database    Pertinent labs & imaging results that were available during my care of the patient were reviewed by me and considered in my medical decision making (see chart for details).    ____________________________________________   FINAL CLINICAL IMPRESSION(S) / ED DIAGNOSES  Final diagnoses:  Altered mental status, unspecified altered mental status type  Acute kidney injury superimposed on chronic kidney disease ([place])      NEW MEDICATIONS STARTED DURING THIS VISIT:  ED Discharge Orders    None       Note:  This document was prepared using Dragon voice recognition software and may include unintentional dictation errors.    Alfred Levins, [place], MD 06/21/17 [phone]

## 2017-06-21 NOTE — ED Triage Notes (Signed)
Pt presents today via [facility] from [facility] dementia unit. Pt is alert non oriented. Pt's daughter at bedside. Pt presents with 22g RAC

## 2017-06-22 ENCOUNTER — Observation Stay
Admit: 2017-06-22 | Discharge: 2017-06-22 | Disposition: A | Discharge status: Home / Self Care | Payer: Medicare Other | Attending: Internal Medicine | Admitting: Internal Medicine

## 2017-06-22 ENCOUNTER — Other Ambulatory Visit: Payer: Self-pay

## 2017-06-22 LAB — BASIC METABOLIC PANEL
ANION GAP: 7 (ref 5–15)
BUN: 27 mg/dL — AB (ref 6–20)
CHLORIDE: 105 mmol/L (ref 101–111)
CO2: 25 mmol/L (ref 22–32)
Calcium: 8.2 mg/dL — ABNORMAL LOW (ref 8.9–10.3)
Creatinine, Ser: 1.48 mg/dL — ABNORMAL HIGH (ref 0.44–1.00)
GFR calc Af Amer: 35 mL/min — ABNORMAL LOW (ref >60)
GFR, EST NON AFRICAN AMERICAN: 31 mL/min — AB (ref >60)
GLUCOSE: 95 mg/dL (ref 65–99)
Potassium: 3.5 mmol/L (ref 3.5–5.1)
SODIUM: 137 mmol/L (ref 135–145)

## 2017-06-22 LAB — CBC
HEMATOCRIT: 35.3 % (ref 35.0–47.0)
HEMOGLOBIN: 11.1 g/dL — AB (ref 12.0–16.0)
MCH: 31 pg (ref 26.0–34.0)
MCHC: 31.6 g/dL — ABNORMAL LOW (ref 32.0–36.0)
MCV: 98.1 fL (ref 80.0–100.0)
Platelets: 130 10*3/uL — ABNORMAL LOW (ref 150–440)
RBC: 3.6 MIL/uL — ABNORMAL LOW (ref 3.80–5.20)
RDW: 17.6 % — ABNORMAL HIGH (ref 11.5–14.5)
WBC: 4.8 10*3/uL (ref 3.6–11.0)

## 2017-06-22 LAB — GLUCOSE, CAPILLARY
GLUCOSE-CAPILLARY: 85 mg/dL (ref 65–99)
GLUCOSE-CAPILLARY: 146 mg/dL — AB (ref 65–99)
GLUCOSE-CAPILLARY: 66 mg/dL (ref 65–99)
GLUCOSE-CAPILLARY: 90 mg/dL (ref 65–99)
Glucose-Capillary: 67 mg/dL (ref 65–99)
Glucose-Capillary: 95 mg/dL (ref 65–99)
Glucose-Capillary: 152 mg/dL — ABNORMAL HIGH (ref 65–99)
Glucose-Capillary: 92 mg/dL (ref 65–99)

## 2017-06-22 LAB — TROPONIN I
TROPONIN I: 0.03 ng/mL — AB (ref <0.03)
TROPONIN I: 0.03 ng/mL — AB (ref <0.03)
TROPONIN I: 0.03 ng/mL — AB (ref <0.03)
Troponin I: 0.04 ng/mL (ref <0.03)

## 2017-06-22 LAB — MRSA PCR SCREENING: MRSA by PCR: NEGATIVE

## 2017-06-22 LAB — HEMOGLOBIN A1C
Hgb A1c MFr Bld: 5.9 % — ABNORMAL HIGH (ref 4.8–5.6)
Mean Plasma Glucose: 122.63 mg/dL

## 2017-06-22 MED ORDER — DEXTROSE 50 % IV SOLN
INTRAVENOUS | Status: AC
  Administered 2017-06-22: 50 mL via INTRAVENOUS
  Filled 2017-06-22: qty 50

## 2017-06-22 MED ORDER — QUETIAPINE FUMARATE 25 MG PO TABS
50.0000 mg | ORAL_TABLET | Freq: Every day | ORAL | Status: DC
Start: 2017-06-22 — End: 2017-06-23
  Administered 2017-06-22: 50 mg via ORAL
  Filled 2017-06-22: qty 2

## 2017-06-22 MED ORDER — INSULIN ASPART 100 UNIT/ML ~~LOC~~ SOLN
0.0000 [IU] | Freq: Every day | SUBCUTANEOUS | Status: DC
Start: 2017-06-22 — End: 2017-06-28

## 2017-06-22 MED ORDER — CLONAZEPAM 0.5 MG PO TABS
0.5000 mg | ORAL_TABLET | Freq: Every day | ORAL | Status: DC | PRN
  Administered 2017-06-22: 0.5 mg via ORAL
  Filled 2017-06-22: qty 1

## 2017-06-22 MED ORDER — ONDANSETRON HCL 4 MG/2ML IJ SOLN: 4.0000 mg | Freq: Four times a day (QID) | INTRAMUSCULAR | Status: DC | PRN

## 2017-06-22 MED ORDER — MIRTAZAPINE 15 MG PO TABS: 30.0000 mg | ORAL_TABLET | Freq: Every day | ORAL | Status: DC

## 2017-06-22 MED ORDER — PANTOPRAZOLE SODIUM 20 MG PO TBEC: 20.0000 mg | DELAYED_RELEASE_TABLET | Freq: Every day | ORAL | Status: DC

## 2017-06-22 MED ORDER — DEXTROSE 50 % IV SOLN
1.0000 | Freq: Once | INTRAVENOUS | Status: AC
  Administered 2017-06-22: 50 mL via INTRAVENOUS

## 2017-06-22 MED ORDER — LAMOTRIGINE 25 MG PO TABS
25.0000 mg | ORAL_TABLET | Freq: Once | ORAL | Status: AC
  Administered 2017-06-22: 25 mg via ORAL
  Filled 2017-06-22 (×2): qty 1

## 2017-06-22 MED ORDER — LAMOTRIGINE 25 MG PO TABS
50.0000 mg | ORAL_TABLET | Freq: Two times a day (BID) | ORAL | Status: DC
  Administered 2017-06-22 – 2017-06-26 (×6): 50 mg via ORAL
  Filled 2017-06-22: qty 2
  Filled 2017-06-22 (×7): qty 1
  Filled 2017-06-22: qty 2

## 2017-06-22 MED ORDER — PANTOPRAZOLE SODIUM 40 MG PO TBEC
40.0000 mg | DELAYED_RELEASE_TABLET | Freq: Every day | ORAL | Status: DC
  Administered 2017-06-22 – 2017-06-26 (×5): 40 mg via ORAL
  Filled 2017-06-22 (×6): qty 1

## 2017-06-22 MED ORDER — MELATONIN 5 MG PO TABS
5.0000 mg | ORAL_TABLET | Freq: Every day | ORAL | Status: DC
  Administered 2017-06-23 – 2017-06-25 (×2): 5 mg via ORAL
  Filled 2017-06-22 (×7): qty 1

## 2017-06-22 MED ORDER — QUETIAPINE FUMARATE 25 MG PO TABS
25.0000 mg | ORAL_TABLET | Freq: Once | ORAL | Status: AC
  Administered 2017-06-22: 25 mg via ORAL
  Filled 2017-06-22: qty 1

## 2017-06-22 MED ORDER — DIPHENHYDRAMINE HCL 50 MG/ML IJ SOLN
12.5000 mg | Freq: Once | INTRAMUSCULAR | Status: AC
  Administered 2017-06-22: 12.5 mg via INTRAVENOUS
  Filled 2017-06-22: qty 1

## 2017-06-22 MED ORDER — ONDANSETRON HCL 4 MG PO TABS: 4.0000 mg | ORAL_TABLET | Freq: Four times a day (QID) | ORAL | Status: DC | PRN

## 2017-06-22 MED ORDER — ACETAMINOPHEN 650 MG RE SUPP
650.0000 mg | Freq: Four times a day (QID) | RECTAL | Status: DC | PRN
  Administered 2017-06-27: 650 mg via RECTAL
  Filled 2017-06-22: qty 1

## 2017-06-22 MED ORDER — ACETAMINOPHEN 325 MG PO TABS: 650.0000 mg | ORAL_TABLET | Freq: Four times a day (QID) | ORAL | Status: DC | PRN

## 2017-06-22 MED ORDER — HALOPERIDOL LACTATE 5 MG/ML IJ SOLN
2.0000 mg | Freq: Once | INTRAMUSCULAR | Status: AC
  Administered 2017-06-22: 2 mg via INTRAVENOUS
  Filled 2017-06-22: qty 1

## 2017-06-22 MED ORDER — MIRTAZAPINE 15 MG PO TABS
30.0000 mg | ORAL_TABLET | Freq: Every day | ORAL | Status: DC
  Administered 2017-06-23 – 2017-06-25 (×2): 30 mg via ORAL
  Filled 2017-06-22 (×4): qty 2

## 2017-06-22 MED ORDER — INSULIN ASPART 100 UNIT/ML ~~LOC~~ SOLN
0.0000 [IU] | Freq: Three times a day (TID) | SUBCUTANEOUS | Status: DC
  Administered 2017-06-26 – 2017-06-28 (×3): 1 [IU] via SUBCUTANEOUS
  Filled 2017-06-22 (×3): qty 1

## 2017-06-22 MED ORDER — PRAVASTATIN SODIUM 20 MG PO TABS
80.0000 mg | ORAL_TABLET | Freq: Every day | ORAL | Status: DC
  Administered 2017-06-22 – 2017-06-26 (×5): 80 mg via ORAL
  Filled 2017-06-22 (×3): qty 2
  Filled 2017-06-22: qty 4
  Filled 2017-06-22 (×3): qty 2

## 2017-06-22 MED ORDER — SODIUM CHLORIDE 0.9 % IV SOLN
INTRAVENOUS | Status: DC
Start: 2017-06-22 — End: 2017-06-23
  Administered 2017-06-22: 16:00:00 via INTRAVENOUS

## 2017-06-22 MED ORDER — HALOPERIDOL LACTATE 5 MG/ML IJ SOLN
1.0000 mg | Freq: Four times a day (QID) | INTRAMUSCULAR | Status: DC | PRN
  Administered 2017-06-22 – 2017-06-23 (×3): 1 mg via INTRAVENOUS
  Filled 2017-06-22 (×3): qty 1

## 2017-06-22 MED ORDER — LISINOPRIL 20 MG PO TABS
40.0000 mg | ORAL_TABLET | Freq: Every day | ORAL | Status: DC
  Administered 2017-06-22 – 2017-06-26 (×4): 40 mg via ORAL
  Filled 2017-06-22 (×5): qty 2

## 2017-06-22 MED ORDER — LORAZEPAM 2 MG/ML IJ SOLN
1.0000 mg | Freq: Once | INTRAMUSCULAR | Status: AC
Start: 2017-06-22 — End: 2017-06-22
  Administered 2017-06-22: 1 mg via INTRAVENOUS
  Filled 2017-06-22: qty 1

## 2017-06-22 MED ORDER — MELATONIN 5 MG PO TABS: 5.0000 mg | ORAL_TABLET | Freq: Every day | ORAL | Status: DC

## 2017-06-22 MED ORDER — HEPARIN SODIUM (PORCINE) 5000 UNIT/ML IJ SOLN
5000.0000 [IU] | Freq: Three times a day (TID) | INTRAMUSCULAR | Status: DC
  Administered 2017-06-22 – 2017-06-28 (×14): 5000 [IU] via SUBCUTANEOUS
  Filled 2017-06-22 (×15): qty 1

## 2017-06-22 MED ORDER — ASPIRIN EC 81 MG PO TBEC
81.0000 mg | DELAYED_RELEASE_TABLET | Freq: Every day | ORAL | Status: DC
  Administered 2017-06-22 – 2017-06-25 (×3): 81 mg via ORAL
  Filled 2017-06-22 (×4): qty 1

## 2017-06-22 MED ORDER — BUSPIRONE HCL 15 MG PO TABS
30.0000 mg | ORAL_TABLET | Freq: Two times a day (BID) | ORAL | Status: DC
  Administered 2017-06-22 – 2017-06-23 (×2): 30 mg via ORAL
  Filled 2017-06-22 (×5): qty 2

## 2017-06-22 NOTE — ED Notes (Signed)
Patient transported to [id number]

## 2017-06-22 NOTE — Progress Notes (Signed)
Hypoglycemic Event  CBG: 66  Treatment: 15 GM carbohydrate snack  Symptoms: None and sleep  Follow-up CBG: Time:1213 CBG Result:95  Possible Reasons for Event: Inadequate meal intake  Comments/MD notified: Dr Alveta HeimlichWieting     Michah Minton D Lesta Limbert

## 2017-06-22 NOTE — Progress Notes (Signed)
Patient ID: Beth Leon, female   DOB: 12/15/1927, 84 y.o.   MRN: [id number]  ACP note.  Patient unable to participate in ACP discussion. Daughter at the bedside who is a healthcare power of attorney.  Diagnosis dementia with behavioral disturbance, diarrhea, hypertension, diabetes, hyperlipidemia, chronic kidney disease stage III, relative hypoglycemia.  As per the daughter, [facility].  The patient keeps saying that she wants to go home and she becomes really agitated and impulsive.  She has had a lot of falls.  She has bruises over her body.  Aggressive.  Patient was brought in with hypotension and acute encephalopathy.  As per the daughter the patient has been increasingly aggressive.  Neurology has been increasing the Lamictal and hoping to get off the Seroquel during the day.  Family interested in quality of life and having her not be as impulsive and agitated.  CODE STATUS discussed.  Patient is a full code.  Time spent on ACP discussion 35 minutes  Dr. Alford Highlandichard Danalee Flath

## 2017-06-22 NOTE — Progress Notes (Signed)
To pt's room. Pt with monitor leads off, gown off, trying to get out of bed, attempting to pull out IV.Attempted to redirect pt back to bed, pt stating that she wants to leave. At this time pt agitated, confused, not wanting to get back to  Bed,becoming physically aggressive. MD notified, up to see pt. Orders placed, mitts placed. Will continue to monitor and assess.

## 2017-06-22 NOTE — Care Management Obs Status (Signed)
MEDICARE OBSERVATION STATUS NOTIFICATION   Patient Details  Name: Beth Leon MRN: [id number] Date of Birth: 12/19/1927   Medicare Observation Status Notification Given:  Yes    Chapman FitchBOWEN, Damari Hiltz T, RN 06/22/2017, 10:45 AM

## 2017-06-22 NOTE — Progress Notes (Signed)
*  PRELIMINARY RESULTS* Echocardiogram 2D Echocardiogram has been performed.  Beth GulaJoan M Keymarion Leon 06/22/2017, 1:37 PM

## 2017-06-22 NOTE — Progress Notes (Signed)
  Hypoglycemic Event  CBG: 67   Treatment: D50 IV 50 mL   Symptoms: None and Asleep  Follow-up CBG: BJYN:[id number]:0845 CBG Result:146  Possible Reasons for Event: Inadequate meal intake and Change in activity  Comments/MD notified: [place]  Olukemi Panchal D Darious Rehman

## 2017-06-22 NOTE — Progress Notes (Signed)
Pt arrived stretcher from ED. Pt moved to bed. Telemetry monitor placed and called to CCMD. Pt asleep at this time. Yellow socks placed. Unable to ask admit questions, pt by herself, unable to arouse, with hx dementia.

## 2017-06-22 NOTE — ED Notes (Signed)
Pt had recent increase in medication to help control her aggression.

## 2017-06-22 NOTE — H&P (Signed)
[facility] [place] at [facility]   PATIENT NAME: Beth Leon    MR#:  [id number]  DATE OF BIRTH:  [birth year]  DATE OF ADMISSION:  06/21/2017  PRIMARY CARE PHYSICIAN: Lynnea Ferrier, MD   REQUESTING/REFERRING PHYSICIAN: Don Perking, MD  CHIEF COMPLAINT:   Chief Complaint  Patient presents with  . Altered Mental Status    HISTORY OF PRESENT ILLNESS:  Beth Leon  is a 84 y.o. female who presents with lethargy and acute encephalopathy.  Patient is recently had some of her medication dosages increased, specifically including lamotrigine.  She is unable to provide history, and information is taken collaterally from nursing facility where she resides.  Reportedly the patient had an episode at dinnertime where she slumped over and became very somnolent.  It is unclear if she had any sort of syncopal event.  Here in the ED she is able to wake up enough to answer some simple questions, but remains very somnolent and lethargic.  Initial workup is largely within normal limits.  Hospitalist were called for admission and further evaluation.  PAST MEDICAL HISTORY:   Past Medical History:  Diagnosis Date  . Diabetes mellitus without complication (HCC)   . High cholesterol   . Hypertension      PAST SURGICAL HISTORY:   Past Surgical History:  Procedure Laterality Date  . ABDOMINAL HYSTERECTOMY    . HERNIA REPAIR       SOCIAL HISTORY:   Social History   Tobacco Use  . Smoking status: Former Games developer  . Smokeless tobacco: Never Used  Substance Use Topics  . Alcohol use: No     FAMILY HISTORY:   Family History  Problem Relation Age of Onset  . Hypertension Mother   . Hyperlipidemia Mother   . Hypertension Father   . Heart attack Father   . Hyperlipidemia Father      DRUG ALLERGIES:   Allergies  Allergen Reactions  . Atorvastatin     Other reaction(s): Unknown  . Codeine Nausea And Vomiting  . Simvastatin     Other reaction(s): Unknown     MEDICATIONS AT HOME:   Prior to Admission medications   Medication Sig Start Date End Date Taking? Authorizing Provider  acetaminophen (TYLENOL) 500 MG tablet Take 1,000 mg by mouth 2 (two) times daily.    Yes [provider]  aspirin EC 81 MG tablet Take 81 mg by mouth at bedtime.   Yes [provider]  busPIRone (BUSPAR) 30 MG tablet Take 30 mg by mouth 2 (two) times daily. 05/11/17  Yes [provider]  clonazePAM (KLONOPIN) 0.5 MG tablet Take 0.5 mg by mouth daily as needed (agitation).   Yes [provider]  cyanocobalamin 1000 MCG tablet Take 500 mcg by mouth daily.   Yes [provider]  cyclobenzaprine (FLEXERIL) 10 MG tablet Take 1 tablet (10 mg total) by mouth at bedtime. 03/09/16  Yes Willy Eddy, MD  gabapentin (NEURONTIN) 300 MG capsule Take 300 mg by mouth 2 (two) times daily.   Yes [provider]  lamoTRIgine (LAMICTAL) 25 MG tablet Take 50 mg by mouth 2 (two) times daily.   Yes [provider]  lisinopril (PRINIVIL,ZESTRIL) 40 MG tablet Take 40 mg by mouth daily.   Yes [provider]  loperamide (IMODIUM) 2 MG capsule Take 2 mg by mouth 4 (four) times daily as needed for diarrhea or loose stools.   Yes [provider]  Melatonin 5 MG TABS Take  5 mg by mouth at bedtime.   Yes [provider]  mirtazapine (REMERON) 30 MG tablet Take 30 mg by mouth at bedtime.   Yes [provider]  omeprazole (PRILOSEC) 20 MG capsule Take 20 mg by mouth daily.   Yes [provider]  pravastatin (PRAVACHOL) 80 MG tablet Take 80 mg by mouth daily.    Yes [provider]  QUEtiapine (SEROQUEL) 25 MG tablet Take 1 tablet (25 mg total) by mouth every 6 (six) hours as needed (agitation). 05/15/17  Yes Clapacs, Jackquline DenmarkJohn T, MD  QUEtiapine (SEROQUEL) 50 MG tablet Take 1 tablet (50 mg total) by mouth 2 (two) times daily. 05/15/17  Yes Clapacs, Jackquline DenmarkJohn T, MD  traMADol (ULTRAM) 50 MG tablet  Take 50 mg by mouth every 8 (eight) hours as needed for moderate pain.    Yes [provider]    REVIEW OF SYSTEMS:  Review of Systems  Unable to perform ROS: Acuity of condition     VITAL SIGNS:   Vitals:   06/21/17 1956 06/21/17 1957 06/21/17 2258  BP: (!) 120/52  (!) 183/70  Pulse: 90  61  Resp: 19  15  Temp: 98.1 F (36.7 C)    TempSrc: Oral    SpO2: 100%  99%  Weight:  68 kg (150 lb)   Height:  5\' 7"  (1.702 m)    Wt Readings from Last 3 Encounters:  06/21/17 68 kg (150 lb)  05/15/17 70.3 kg (155 lb)  12/21/16 74.4 kg (164 lb)    PHYSICAL EXAMINATION:  Physical Exam  Vitals reviewed. Constitutional: She appears well-developed and well-nourished. No distress.  HENT:  Head: Normocephalic and atraumatic.  Mouth/Throat: Oropharynx is clear and moist.  Eyes: Pupils are equal, round, and reactive to light. Conjunctivae and EOM are normal. No scleral icterus.  Neck: Normal range of motion. Neck supple. No JVD present. No thyromegaly present.  Cardiovascular: Normal rate, regular rhythm and intact distal pulses. Exam reveals no gallop and no friction rub.  No murmur heard. Respiratory: Effort normal and breath sounds normal. No respiratory distress. She has no wheezes. She has no rales.  GI: Soft. Bowel sounds are normal. She exhibits no distension. There is no tenderness.  Musculoskeletal: Normal range of motion. She exhibits no edema.  No arthritis, no gout  Lymphadenopathy:    She has no cervical adenopathy.  Neurological:  Patient is very somnolent, but will arouse enough to speak some and follow some simple commands.  On limited exam she does not seem to have any strong focal neuro deficits, with decent bilateral grip strength and spontaneous movement of bilateral lower extremities, and no perceptible slurring of her speech.  Skin: Skin is warm and dry. No rash noted. No erythema.  Psychiatric:  Unable to assess due to patient condition    LABORATORY  PANEL:   CBC Recent Labs  Lab 06/21/17 2055  WBC 5.6  HGB 11.1*  HCT 34.0*  PLT 119*   ------------------------------------------------------------------------------------------------------------------  Chemistries  Recent Labs  Lab 06/21/17 2055  NA 135  K 3.6  CL 102  CO2 25  GLUCOSE 105*  BUN 29*  CREATININE 1.61*  CALCIUM 8.8*  AST 48*  ALT 21  ALKPHOS 64  BILITOT 0.7   ------------------------------------------------------------------------------------------------------------------  Cardiac Enzymes Recent Labs  Lab 06/21/17 2055  TROPONINI 0.03*   ------------------------------------------------------------------------------------------------------------------  RADIOLOGY:  Ct Head Wo Contrast  Result Date: 06/21/2017 CLINICAL DATA:  Altered mental status.  Dementia. EXAM: CT HEAD WITHOUT CONTRAST TECHNIQUE: Contiguous  axial images were obtained from the base of the skull through the vertex without intravenous contrast. COMPARISON:  04/05/2017. FINDINGS: Brain: No evidence for acute infarction, hemorrhage, mass lesion, hydrocephalus, or extra-axial fluid. Generalized atrophy. Chronic microvascular ischemic change throughout the white matter. Chronic LEFT parietal infarct. Vascular: Calcification of the cavernous internal carotid arteries consistent with cerebrovascular atherosclerotic disease. No signs of intracranial large vessel occlusion. Skull: Normal. Negative for fracture or focal lesion. Sinuses/Orbits: No acute finding. Other: None. IMPRESSION: Stable exam. Atrophy and small vessel disease. No acute intracranial abnormality is evident. Electronically Signed   By: Elsie Stain M.D.   On: 06/21/2017 20:35    EKG:   Orders placed or performed during the hospital encounter of 06/21/17  . ED EKG  . ED EKG    IMPRESSION AND PLAN:  Principal Problem:   Acute encephalopathy -unclear etiology, though medication side effects is high on the list of  differential here.  Her increase in Lamictal dose plus the other medication she takes such as Seroquel, gabapentin, clonazepam, and Remeron could potentially be causing her symptoms.  We will monitor her closely tonight for improvement.  If she is not improving by later on this morning could consider neurology consult and potential further workup for something like stroke. Active Problems:   Essential hypertension -continue home meds   Diabetes (HCC) -sliding scale insulin with corresponding glucose checks   Hyperlipidemia -home dose antilipid   CKD (chronic kidney disease), stage III (HCC) -avoid nephrotoxins and monitor  Chart review performed and case discussed with ED provider. Labs, imaging and/or ECG reviewed by provider and discussed with patient/family. Management plans discussed with the patient and/or family.  DVT PROPHYLAXIS: SubQ heparin  GI PROPHYLAXIS: None  ADMISSION STATUS: Observation  CODE STATUS: No Order  TOTAL TIME TAKING CARE OF THIS PATIENT: 40 minutes.   Lazer Wollard FIELDING 06/22/2017, 12:19 AM  [facility]  Office  [phone]  CC: Primary care physician; Lynnea Ferrier, MD  Note:  This document was prepared using Dragon voice recognition software and may include unintentional dictation errors.

## 2017-06-22 NOTE — Care Management (Signed)
Patient admitted with acute encephalopathy.  Patient sleeping at this time.  2 daughters are at bedside.  Daughter [name] states that she is HPOA and paper work is on chart.  [name] states that [facility].  At baseline patient ambulates with walker, and is able to feed self.  Needs assistance with ADL's.  PCP Graciela HusbandsKlein.  Medications are filled through [facility].

## 2017-06-22 NOTE — Progress Notes (Signed)
Pt has become more alert throughout this day, she has become agitated, implulsive and slightly aggressive. MD called for PRN medications, Haldol PRN, and one dose of 25mg  Lamictal ordered. Pt continues to try to climb out of bed. Patient has been placed in a low bed, pads in place on floor, in front of nurses station with bed alarm on. Daughter, [name] at bedside helps to calm and reorient patient.

## 2017-06-23 LAB — BASIC METABOLIC PANEL
Anion gap: 7 (ref 5–15)
BUN: 15 mg/dL (ref 6–20)
CALCIUM: 9.4 mg/dL (ref 8.9–10.3)
CHLORIDE: 109 mmol/L (ref 101–111)
CO2: 26 mmol/L (ref 22–32)
CREATININE: 0.93 mg/dL (ref 0.44–1.00)
GFR calc non Af Amer: 54 mL/min — ABNORMAL LOW (ref >60)
Glucose, Bld: 100 mg/dL — ABNORMAL HIGH (ref 65–99)
Potassium: 3.4 mmol/L — ABNORMAL LOW (ref 3.5–5.1)
SODIUM: 142 mmol/L (ref 135–145)

## 2017-06-23 LAB — GLUCOSE, CAPILLARY
GLUCOSE-CAPILLARY: 63 mg/dL — AB (ref 65–99)
GLUCOSE-CAPILLARY: 107 mg/dL — AB (ref 65–99)
Glucose-Capillary: 100 mg/dL — ABNORMAL HIGH (ref 65–99)
Glucose-Capillary: 81 mg/dL (ref 65–99)
Glucose-Capillary: 108 mg/dL — ABNORMAL HIGH (ref 65–99)

## 2017-06-23 LAB — MAGNESIUM: Magnesium: 1.9 mg/dL (ref 1.7–2.4)

## 2017-06-23 MED ORDER — CLONIDINE HCL 0.1 MG PO TABS
0.1000 mg | ORAL_TABLET | Freq: Four times a day (QID) | ORAL | Status: DC | PRN
  Administered 2017-06-23: 0.1 mg via ORAL
  Filled 2017-06-23: qty 1

## 2017-06-23 MED ORDER — DIPHENHYDRAMINE HCL 50 MG/ML IJ SOLN
25.0000 mg | Freq: Once | INTRAMUSCULAR | Status: AC | PRN
  Administered 2017-06-23: 25 mg via INTRAVENOUS
  Filled 2017-06-23: qty 1

## 2017-06-23 MED ORDER — GABAPENTIN 300 MG PO CAPS
300.0000 mg | ORAL_CAPSULE | Freq: Two times a day (BID) | ORAL | Status: DC
  Administered 2017-06-23 – 2017-06-26 (×5): 300 mg via ORAL
  Filled 2017-06-23 (×9): qty 1

## 2017-06-23 MED ORDER — BUSPIRONE HCL 15 MG PO TABS
15.0000 mg | ORAL_TABLET | Freq: Two times a day (BID) | ORAL | Status: DC
  Administered 2017-06-23 – 2017-06-26 (×4): 15 mg via ORAL
  Filled 2017-06-23 (×9): qty 1

## 2017-06-23 MED ORDER — METOPROLOL TARTRATE 25 MG PO TABS
25.0000 mg | ORAL_TABLET | Freq: Two times a day (BID) | ORAL | Status: DC
  Administered 2017-06-23 – 2017-06-26 (×5): 25 mg via ORAL
  Filled 2017-06-23 (×6): qty 1

## 2017-06-23 MED ORDER — ARIPIPRAZOLE 2 MG PO TABS
2.0000 mg | ORAL_TABLET | Freq: Four times a day (QID) | ORAL | Status: DC | PRN
  Administered 2017-06-24: 2 mg via ORAL
  Filled 2017-06-23 (×2): qty 1

## 2017-06-23 MED ORDER — HALOPERIDOL LACTATE 5 MG/ML IJ SOLN
2.0000 mg | Freq: Four times a day (QID) | INTRAMUSCULAR | Status: DC | PRN
  Administered 2017-06-23 – 2017-06-25 (×4): 2 mg via INTRAVENOUS
  Filled 2017-06-23 (×4): qty 1

## 2017-06-23 MED ORDER — QUETIAPINE FUMARATE 25 MG PO TABS
50.0000 mg | ORAL_TABLET | Freq: Two times a day (BID) | ORAL | Status: DC
  Administered 2017-06-23: 50 mg via ORAL
  Filled 2017-06-23: qty 2

## 2017-06-23 MED ORDER — ARIPIPRAZOLE 5 MG PO TABS
5.0000 mg | ORAL_TABLET | Freq: Two times a day (BID) | ORAL | Status: DC
  Administered 2017-06-23 – 2017-06-24 (×2): 5 mg via ORAL
  Filled 2017-06-23 (×5): qty 1

## 2017-06-23 MED ORDER — POTASSIUM CHLORIDE CRYS ER 20 MEQ PO TBCR
40.0000 meq | EXTENDED_RELEASE_TABLET | Freq: Once | ORAL | Status: AC
  Administered 2017-06-23: 40 meq via ORAL
  Filled 2017-06-23: qty 2

## 2017-06-23 MED ORDER — ARIPIPRAZOLE 9.75 MG/1.3ML IM SOLN: 5.2500 mg | Freq: Four times a day (QID) | INTRAMUSCULAR | Status: DC | PRN

## 2017-06-23 MED ORDER — DIPHENHYDRAMINE HCL 50 MG/ML IJ SOLN: 25.0000 mg | Freq: Once | INTRAMUSCULAR | Status: DC

## 2017-06-23 MED ORDER — HYDRALAZINE HCL 20 MG/ML IJ SOLN
10.0000 mg | Freq: Four times a day (QID) | INTRAMUSCULAR | Status: DC | PRN
  Administered 2017-06-27: 07:00:00 10 mg via INTRAVENOUS
  Filled 2017-06-23: qty 1

## 2017-06-23 NOTE — Progress Notes (Signed)
Re-contacted Dr. Vilma PraderIsbell (psychiatry), as the existing consult is almost 24 hours old and this particular consult is strongly advised at this time. Physician states to unit secretary that he/she will be by "later" to evaluate patient. Will continue to monitor neurological status. Jari FavreSteven M [facility] Care[facility]

## 2017-06-23 NOTE — Consult Note (Signed)
[facility] Consult   Reason for Consult:  Dementia, agitation Referring Physician:  Dr. Bridgett Larsson  Patient Identification: Beth Leon MRN:  [id number] Principal Diagnosis: Acute encephalopathy Diagnosis:   Patient Active Problem List   Diagnosis Date Noted  . Acute encephalopathy [G93.40] 06/21/2017  . CKD (chronic kidney disease), stage III ([place]) [N18.3] 06/21/2017  . Carotid stenosis [I65.29] 12/21/2016  . Essential hypertension [I10] 12/21/2016  . Diabetes ([place]) [E11.9] 12/21/2016  . Hyperlipidemia [E78.5] 12/21/2016    Total Time spent with patient: 45 minutes  Subjective:   Beth Leon is a 84 y.o. female patient admitted with encephalopathy.Marland Kitchen  HPI:   84 yo presenting with encephalopathy.  Per daughter she has been declining for the past 3 weeks.  A month ago pt could walk with walker, feed herself, was still irritable.  However in the past 3 weeks she has become more confused, irritable, yelling, screaming, - police have been called to the nursing home, she has been taken to the ER - seen by neurology earlier this week who added lamotrigine.  She is on a fair amount of psychiatric medications and daughter doesn't really know what's helping or not.  Since being in the hospital pt has been agitated, pulling at line.  Also of note last month on 2/12 seen in the ER for agitation. Seen by Dr. Weber Cooks who recommended increasing quetiapine.    Past Psychiatric History:   Risk to Self: Is patient at risk for suicide?: No Risk to Others:   Prior Inpatient Therapy:   Prior Outpatient Therapy:    Past Medical History:  Past Medical History:  Diagnosis Date  . Diabetes mellitus without complication ([place])   . High cholesterol   . Hypertension     Past Surgical History:  Procedure Laterality Date  . ABDOMINAL HYSTERECTOMY    . HERNIA REPAIR     Family History:  Family History  Problem Relation Age of Onset  . Hypertension Mother   . Hyperlipidemia Mother    . Hypertension Father   . Heart attack Father   . Hyperlipidemia Father     Social History:  Social History   Substance and Sexual Activity  Alcohol Use No     Social History   Substance and Sexual Activity  Drug Use Not on file    Social History   Socioeconomic History  . Marital status: Widowed    Spouse name: Not on file  . Number of children: Not on file  . Years of education: Not on file  . Highest education level: Not on file  Occupational History  . Not on file  Social Needs  . Financial resource strain: Not on file  . Food insecurity:    Worry: Not on file    Inability: Not on file  . Transportation needs:    Medical: Not on file    Non-medical: Not on file  Tobacco Use  . Smoking status: Former Research scientist (life sciences)  . Smokeless tobacco: Never Used  Substance and Sexual Activity  . Alcohol use: No  . Drug use: Not on file  . Sexual activity: Not on file  Lifestyle  . Physical activity:    Days per week: Not on file    Minutes per session: Not on file  . Stress: Not on file  Relationships  . Social connections:    Talks on phone: Not on file    Gets together: Not on file    Attends religious service: Not on file  Active member of club or organization: Not on file    Attends meetings of clubs or organizations: Not on file    Relationship status: Not on file  Other Topics Concern  . Not on file  Social History Narrative  . Not on file   Additional Social History:    Allergies:   Allergies  Allergen Reactions  . Atorvastatin     Other reaction(s): Unknown  . Codeine Nausea And Vomiting  . Simvastatin     Other reaction(s): Unknown    Labs:  Results for orders placed or performed during the hospital encounter of 06/21/17 (from the past 48 hour(s))  CBC with Differential/Platelet     Status: Abnormal   Collection Time: 06/21/17  8:55 PM  Result Value Ref Range   WBC 5.6 3.6 - 11.0 K/uL   RBC 3.58 (L) 3.80 - 5.20 MIL/uL   Hemoglobin 11.1 (L) 12.0  - 16.0 g/dL   HCT 34.0 (L) 35.0 - 47.0 %   MCV 95.0 80.0 - 100.0 fL   MCH 30.9 26.0 - 34.0 pg   MCHC 32.6 32.0 - 36.0 g/dL   RDW 16.5 (H) 11.5 - 14.5 %   Platelets 119 (L) 150 - 440 K/uL   Neutrophils Relative % 69 %   Neutro Abs 3.9 1.4 - 6.5 K/uL   Lymphocytes Relative 15 %   Lymphs Abs 0.8 (L) 1.0 - 3.6 K/uL   Monocytes Relative 13 %   Monocytes Absolute 0.7 0.2 - 0.9 K/uL   Eosinophils Relative 2 %   Eosinophils Absolute 0.1 0 - 0.7 K/uL   Basophils Relative 1 %   Basophils Absolute 0.0 0 - 0.1 K/uL    Comment: Performed at [facility], [address] [place], [place] [address]  Comprehensive metabolic panel     Status: Abnormal   Collection Time: 06/21/17  8:55 PM  Result Value Ref Range   Sodium 135 135 - 145 mmol/L   Potassium 3.6 3.5 - 5.1 mmol/L   Chloride 102 101 - 111 mmol/L   CO2 25 22 - 32 mmol/L   Glucose, Bld 105 (H) 65 - 99 mg/dL   BUN 29 (H) 6 - 20 mg/dL   Creatinine, Ser 1.61 (H) 0.44 - 1.00 mg/dL   Calcium 8.8 (L) 8.9 - 10.3 mg/dL   Total Protein 5.9 (L) 6.5 - 8.1 g/dL   Albumin 3.2 (L) 3.5 - 5.0 g/dL   AST 48 (H) 15 - 41 U/L   ALT 21 14 - 54 U/L   Alkaline Phosphatase 64 38 - 126 U/L   Total Bilirubin 0.7 0.3 - 1.2 mg/dL   GFR calc non Af Amer 28 (L) >60 mL/min   GFR calc Af Amer 32 (L) >60 mL/min    Comment: (NOTE) The eGFR has been calculated using the CKD EPI equation. This calculation has not been validated in all clinical situations. eGFR's persistently <60 mL/min signify possible Chronic Kidney Disease.    Anion gap 8 5 - 15    Comment: Performed at [facility], [address] [place], [place] [address]  Urinalysis, Complete w Microscopic     Status: Abnormal   Collection Time: 06/21/17  8:55 PM  Result Value Ref Range   Color, Urine AMBER (A) YELLOW    Comment: BIOCHEMICALS MAY BE AFFECTED BY COLOR   APPearance CLEAR (A) CLEAR   Specific Gravity, Urine 1.031 (H) 1.005 - 1.030   pH 5.0 5.0 - 8.0   Glucose, UA  NEGATIVE NEGATIVE mg/dL   Hgb urine dipstick NEGATIVE NEGATIVE   Bilirubin Urine NEGATIVE NEGATIVE   Ketones, ur NEGATIVE NEGATIVE mg/dL   Protein, ur NEGATIVE NEGATIVE mg/dL   Nitrite NEGATIVE NEGATIVE   Leukocytes, UA NEGATIVE NEGATIVE   RBC / HPF 0-5 0 - 5 RBC/hpf   WBC, UA 0-5 0 - 5 WBC/hpf   Bacteria, UA NONE SEEN NONE SEEN   Squamous Epithelial / LPF 0-5 (A) NONE SEEN   Mucus PRESENT    Hyaline Casts, UA PRESENT     Comment: Performed at [facility], [address] [place], [place] [address]  Troponin I     Status: Abnormal   Collection Time: 06/21/17  8:55 PM  Result Value Ref Range   Troponin I 0.03 (HH) <0.03 ng/mL    Comment: CRITICAL RESULT CALLED TO, READ BACK BY AND VERIFIED WITH LORRIE LEMONS ON 06/21/17 AT 2211 JAG Performed at [facility], [address] [place], [place] [address]   Troponin I     Status: Abnormal   Collection Time: 06/22/17  1:32 AM  Result Value Ref Range   Troponin I 0.03 (HH) <0.03 ng/mL    Comment: CRITICAL VALUE NOTED. VALUE IS CONSISTENT WITH PREVIOUSLY REPORTED/CALLED VALUE.[facility] Performed at [facility], [address] [place], [place] [address]   Glucose, capillary     Status: None   Collection Time: 06/22/17  2:22 AM  Result Value Ref Range   Glucose-Capillary 85 65 - 99 mg/dL   Comment 1 Notify RN    Comment 2 Document in Chart   Basic metabolic panel     Status: Abnormal   Collection Time: 06/22/17  2:53 AM  Result Value Ref Range   Sodium 137 135 - 145 mmol/L   Potassium 3.5 3.5 - 5.1 mmol/L   Chloride 105 101 - 111 mmol/L   CO2 25 22 - 32 mmol/L   Glucose, Bld 95 65 - 99 mg/dL   BUN 27 (H) 6 - 20 mg/dL   Creatinine, Ser 1.48 (H) 0.44 - 1.00 mg/dL   Calcium 8.2 (L) 8.9 - 10.3 mg/dL   GFR calc non Af Amer 31 (L) >60 mL/min   GFR calc Af Amer 35 (L) >60 mL/min    Comment: (NOTE) The eGFR has been calculated using the CKD EPI equation. This calculation has not been validated in all clinical  situations. eGFR's persistently <60 mL/min signify possible Chronic Kidney Disease.    Anion gap 7 5 - 15    Comment: Performed at [facility], [address] [place], [place] [address]  CBC     Status: Abnormal   Collection Time: 06/22/17  2:53 AM  Result Value Ref Range   WBC 4.8 3.6 - 11.0 K/uL   RBC 3.60 (L) 3.80 - 5.20 MIL/uL   Hemoglobin 11.1 (L) 12.0 - 16.0 g/dL   HCT 35.3 35.0 - 47.0 %   MCV 98.1 80.0 - 100.0 fL   MCH 31.0 26.0 - 34.0 pg   MCHC 31.6 (L) 32.0 - 36.0 g/dL   RDW 17.6 (H) 11.5 - 14.5 %   Platelets 130 (L) 150 - 440 K/uL    Comment: Performed at [facility], [address] [place], [place] [address]  Troponin I     Status: Abnormal   Collection Time: 06/22/17  2:53 AM  Result Value Ref Range   Troponin I 0.04 (HH) <0.03 ng/mL    Comment: CRITICAL VALUE NOTED. VALUE IS CONSISTENT WITH  PREVIOUSLY REPORTED/CALLED VALUE...[facility] Performed at [facility], [address] [place], [place] [address]   MRSA PCR Screening     Status: None   Collection Time: 06/22/17  2:55 AM  Result Value Ref Range   MRSA by PCR NEGATIVE NEGATIVE    Comment:        The GeneXpert MRSA Assay (FDA approved for NASAL specimens only), is one component of a comprehensive MRSA colonization surveillance program. It is not intended to diagnose MRSA infection nor to guide or monitor treatment for MRSA infections. Performed at [facility], [address] [place], [place] [address]   Glucose, capillary     Status: None   Collection Time: 06/22/17  7:39 AM  Result Value Ref Range   Glucose-Capillary 67 65 - 99 mg/dL  Troponin I     Status: Abnormal   Collection Time: 06/22/17  8:36 AM  Result Value Ref Range   Troponin I 0.03 (HH) <0.03 ng/mL    Comment: CRITICAL VALUE NOTED. VALUE IS CONSISTENT WITH PREVIOUSLY REPORTED/CALLED VALUE / [place] Performed at [facility], [address] [place], [place] [address]   Glucose, capillary      Status: Abnormal   Collection Time: 06/22/17  8:49 AM  Result Value Ref Range   Glucose-Capillary 146 (H) 65 - 99 mg/dL  Glucose, capillary     Status: None   Collection Time: 06/22/17 11:47 AM  Result Value Ref Range   Glucose-Capillary 66 65 - 99 mg/dL   Comment 1 Notify RN   Glucose, capillary     Status: None   Collection Time: 06/22/17 12:13 PM  Result Value Ref Range   Glucose-Capillary 95 65 - 99 mg/dL  Troponin I     Status: Abnormal   Collection Time: 06/22/17  2:55 PM  Result Value Ref Range   Troponin I 0.03 (HH) <0.03 ng/mL    Comment: CRITICAL VALUE NOTED. VALUE IS CONSISTENT WITH PREVIOUSLY REPORTED/CALLED VALUE / [place] Performed at [facility], [address] [place], [place] [address]   Hemoglobin A1c     Status: Abnormal   Collection Time: 06/22/17  2:55 PM  Result Value Ref Range   Hgb A1c MFr Bld 5.9 (H) 4.8 - 5.6 %    Comment: (NOTE) Pre diabetes:          5.7%-6.4% Diabetes:              >6.4% Glycemic control for   <7.0% adults with diabetes    Mean Plasma Glucose 122.63 mg/dL    Comment: Performed at [facility] [address], [place], [place] [address]  Glucose, capillary     Status: Abnormal   Collection Time: 06/22/17  3:04 PM  Result Value Ref Range   Glucose-Capillary 152 (H) 65 - 99 mg/dL   Comment 1 Notify RN   Glucose, capillary     Status: None   Collection Time: 06/22/17  5:01 PM  Result Value Ref Range   Glucose-Capillary 90 65 - 99 mg/dL   Comment 1 Notify RN   Glucose, capillary     Status: None   Collection Time: 06/22/17 10:14 PM  Result Value Ref Range   Glucose-Capillary 92 65 - 99 mg/dL  Glucose, capillary     Status: Abnormal   Collection Time: 06/23/17  8:55 AM  Result Value Ref Range   Glucose-Capillary 63 (L) 65 - 99 mg/dL   Comment 1 Notify RN    Comment 2 Document in  Chart   Basic metabolic panel     Status: Abnormal   Collection Time: 06/23/17  8:57 AM  Result Value Ref Range   Sodium 142 135 -  145 mmol/L   Potassium 3.4 (L) 3.5 - 5.1 mmol/L   Chloride 109 101 - 111 mmol/L   CO2 26 22 - 32 mmol/L   Glucose, Bld 100 (H) 65 - 99 mg/dL   BUN 15 6 - 20 mg/dL   Creatinine, Ser 0.93 0.44 - 1.00 mg/dL   Calcium 9.4 8.9 - 10.3 mg/dL   GFR calc non Af Amer 54 (L) >60 mL/min   GFR calc Af Amer >60 >60 mL/min    Comment: (NOTE) The eGFR has been calculated using the CKD EPI equation. This calculation has not been validated in all clinical situations. eGFR's persistently <60 mL/min signify possible Chronic Kidney Disease.    Anion gap 7 5 - 15    Comment: Performed at [facility], [address] [place], [place] [address]  Magnesium     Status: None   Collection Time: 06/23/17  8:57 AM  Result Value Ref Range   Magnesium 1.9 1.7 - 2.4 mg/dL    Comment: Performed at [facility], [address] [place], [place] [address]  Glucose, capillary     Status: Abnormal   Collection Time: 06/23/17  9:52 AM  Result Value Ref Range   Glucose-Capillary 100 (H) 65 - 99 mg/dL  Glucose, capillary     Status: None   Collection Time: 06/23/17 12:05 PM  Result Value Ref Range   Glucose-Capillary 81 65 - 99 mg/dL   Comment 1 Notify RN    Comment 2 Document in Chart   Glucose, capillary     Status: Abnormal   Collection Time: 06/23/17  6:32 PM  Result Value Ref Range   Glucose-Capillary 107 (H) 65 - 99 mg/dL    Current Facility-Administered Medications  Medication Dose Route Frequency Provider Last Rate Last Dose  . acetaminophen (TYLENOL) tablet 650 mg  650 mg Oral Q6H PRN Lance Coon, MD       Or  . acetaminophen (TYLENOL) suppository 650 mg  650 mg Rectal Q6H PRN Lance Coon, MD      . aspirin EC tablet 81 mg  81 mg Oral Corwin Levins, MD   81 mg at 06/22/17 2153  . busPIRone (BUSPAR) tablet 30 mg  30 mg Oral BID Lance Coon, MD   30 mg at 06/23/17 0843  . clonazePAM (KLONOPIN) tablet 0.5 mg  0.5 mg Oral Daily PRN Harrie Foreman, MD   0.5 mg at 06/22/17  1622  . cloNIDine (CATAPRES) tablet 0.1 mg  0.1 mg Oral Q6H PRN Demetrios Loll, MD   0.1 mg at 06/23/17 1025  . gabapentin (NEURONTIN) capsule 300 mg  300 mg Oral BID Demetrios Loll, MD   300 mg at 06/23/17 1115  . haloperidol lactate (HALDOL) injection 1 mg  1 mg Intravenous Q6H PRN Loletha Grayer, MD   1 mg at 06/23/17 1432  . heparin injection 5,000 Units  5,000 Units Subcutaneous Driscilla Moats, MD   5,000 Units at 06/23/17 1316  . hydrALAZINE (APRESOLINE) injection 10 mg  10 mg Intravenous Q6H PRN Demetrios Loll, MD      . insulin aspart (novoLOG) injection 0-5 Units  0-5 Units Subcutaneous QHS Lance Coon, MD      . insulin aspart (novoLOG) injection 0-9 Units  0-9 Units Subcutaneous TID WC Lance Coon, MD      .  lamoTRIgine (LAMICTAL) tablet 50 mg  50 mg Oral BID Loletha Grayer, MD   50 mg at 06/23/17 0844  . lisinopril (PRINIVIL,ZESTRIL) tablet 40 mg  40 mg Oral Daily Lance Coon, MD   40 mg at 06/23/17 0843  . Melatonin TABS 5 mg  5 mg Oral QHS Harrie Foreman, MD      . metoprolol tartrate (LOPRESSOR) tablet 25 mg  25 mg Oral BID Demetrios Loll, MD   25 mg at 06/23/17 1115  . mirtazapine (REMERON) tablet 30 mg  30 mg Oral QHS Harrie Foreman, MD      . ondansetron [facility]) tablet 4 mg  4 mg Oral Q6H PRN Lance Coon, MD       Or  . ondansetron [facility]) injection 4 mg  4 mg Intravenous Q6H PRN Lance Coon, MD      . pantoprazole (PROTONIX) EC tablet 40 mg  40 mg Oral Daily Lance Coon, MD   40 mg at 06/23/17 0844  . pravastatin (PRAVACHOL) tablet 80 mg  80 mg Oral Daily Lance Coon, MD   80 mg at 06/23/17 0844  . QUEtiapine (SEROQUEL) tablet 50 mg  50 mg Oral BID Demetrios Loll, MD   50 mg at 06/23/17 1115    Musculoskeletal: Strength & Muscle Tone: decreased Gait & Station: unable to stand Patient leans: N/A  Psychiatric Specialty Exam: Physical Exam  Review of Systems  Psychiatric/Behavioral: Positive for memory loss.    Blood pressure (!) 163/83, pulse 78, temperature  (!) 97.5 F (36.4 C), resp. rate 18, height '5\' 7"'$  (1.702 m), weight 72.1 kg (159 lb), SpO2 93 %.Body mass index is 24.9 kg/m.   Pt in bed with eyes closed, pulling at lines.  Not speaking. Cannot engage in full mental status exam. Oriented to person per daughter.  Treatment Plan Summary:  89 yo presenting with dementia and delirium.  Found to have ARG, HTN urgency, hypokalemic.  Dementia at baseline now with delirium overlying   Dementia +  Delirium -d/c quetiapine '50mg'$  bid... Pt been on this regimen for over a month with poor effect. I'm also worried it's contributing to her falls -start aripiprazole '5mg'$  bid.  I wanted to use abilify IM too for agitation but I was informed it is not stocked here.   Will give aripiprazole '2mg'$  prn agitation if pt willing to take a po  -increase haloperidol to '2mg'$  IV for agitation. Does pt need IV access? If not can give haloperidol IM and remove her IV access...one less thing for her to pull at and be bothered by   -decrease buspar to '15mg'$  bid (likely will try to wean off of this)   -d/c clonazepam as it could be worsening delirium. It can be ordered by physician if pt agitated but would rather not have it as a prn   -pls obtain EKG so QTc interval can be checked  Discharge Daughter wants to know if pt can be considered for admission (preferably at a geropsych unit) when pt ready for discharge.  Depends on pt's condition at discharge...she may need a SNF. But if pt able to go home then referral to geropsych instead could be appropriate   Psychiatry will follow daily. Thank you for the consult .   Jolene Schimke, MD 06/23/2017 7:16 PM

## 2017-06-23 NOTE — Clinical Social Work Note (Signed)
The CSW has spoken to the patient's HCPOA via telephone to discuss goals of care. [name] has requested a referral for geripsych due to the ongoing issues with dementia related mentation and behaviors. The CSW and [name] have agreed to meet at bedside to discuss in more depth tomorrow at 1:00pm. Psych consult is still pending.  Beth PonderKaren Martha Brodyn Leon, MSW, Theresia MajorsLCSWA [phone]

## 2017-06-23 NOTE — NC FL2 (Signed)
[place] MEDICAID FL2 LEVEL OF CARE SCREENING TOOL     IDENTIFICATION  Patient Name: Beth Leon Birthdate: March 23, 1928 Sex: female Admission Date (Current Location): 06/21/2017  [place] and [place] Number:  Chiropodist and Address:  [facility], [address], [place], [place] [address]      Provider Number: [id number]  Attending Physician Name and Address:  Shaune Pollack, MD  Relative Name and Phone Number:  [name] (Daughter/HCPOA): [phone]    Current Level of Care: Hospital Recommended Level of Care: Memory Care Prior Approval Number:    Date Approved/Denied:   PASRR Number:    Discharge Plan: Other (Comment)([facility])    Current Diagnoses: Patient Active Problem List   Diagnosis Date Noted  . Acute encephalopathy 06/21/2017  . CKD (chronic kidney disease), stage III ([facility]) 06/21/2017  . Carotid stenosis 12/21/2016  . Essential hypertension 12/21/2016  . Diabetes ([facility]) 12/21/2016  . Hyperlipidemia 12/21/2016    Orientation RESPIRATION BLADDER Height & Weight     Self  Normal Incontinent Weight: 156 lb 1.4 oz (70.8 kg) Height:  5\' 7"  (170.2 cm)  BEHAVIORAL SYMPTOMS/MOOD NEUROLOGICAL BOWEL NUTRITION STATUS      Incontinent Diet(Heart Healthy/Carb Modified)  AMBULATORY STATUS COMMUNICATION OF NEEDS Skin   Supervision Verbally Normal                       Personal Care Assistance Level of Assistance  Bathing, Feeding, Dressing Bathing Assistance: Limited assistance Feeding assistance: Independent Dressing Assistance: Limited assistance     Functional Limitations Info             SPECIAL CARE FACTORS FREQUENCY                       Contractures Contractures Info: Not present    Additional Factors Info  Code Status, Allergies, Psychotropic Code Status Info: Full Allergies Info: Atorvastatin, Codeine, Simvastatin Psychotropic Info: Buspar, Klonopin         Current Medications (06/23/2017):   This is the current hospital active medication list Current Facility-Administered Medications  Medication Dose Route Frequency Provider Last Rate Last Dose  . acetaminophen (TYLENOL) tablet 650 mg  650 mg Oral Q6H PRN Oralia Manis, MD       Or  . acetaminophen (TYLENOL) suppository 650 mg  650 mg Rectal Q6H PRN Oralia Manis, MD      . aspirin EC tablet 81 mg  81 mg Oral Lamont Snowball, MD   81 mg at 06/22/17 2153  . busPIRone (BUSPAR) tablet 30 mg  30 mg Oral BID Oralia Manis, MD   30 mg at 06/23/17 0843  . clonazePAM (KLONOPIN) tablet 0.5 mg  0.5 mg Oral Daily PRN Arnaldo Natal, MD   0.5 mg at 06/22/17 1622  . cloNIDine (CATAPRES) tablet 0.1 mg  0.1 mg Oral Q6H PRN Shaune Pollack, MD   0.1 mg at 06/23/17 1025  . gabapentin (NEURONTIN) capsule 300 mg  300 mg Oral BID Shaune Pollack, MD   300 mg at 06/23/17 1115  . haloperidol lactate (HALDOL) injection 1 mg  1 mg Intravenous Q6H PRN Alford Highland, MD   1 mg at 06/23/17 1432  . heparin injection 5,000 Units  5,000 Units Subcutaneous Willow Ora, MD   5,000 Units at 06/23/17 1316  . hydrALAZINE (APRESOLINE) injection 10 mg  10 mg Intravenous Q6H PRN Shaune Pollack, MD      . insulin aspart (novoLOG) injection 0-5  Units  0-5 Units Subcutaneous QHS Oralia ManisWillis, David, MD      . insulin aspart (novoLOG) injection 0-9 Units  0-9 Units Subcutaneous TID WC Oralia ManisWillis, David, MD      . lamoTRIgine (LAMICTAL) tablet 50 mg  50 mg Oral BID Alford HighlandWieting, Richard, MD   50 mg at 06/23/17 0844  . lisinopril (PRINIVIL,ZESTRIL) tablet 40 mg  40 mg Oral Daily Oralia ManisWillis, David, MD   40 mg at 06/23/17 0843  . Melatonin TABS 5 mg  5 mg Oral QHS Arnaldo Nataliamond, Michael S, MD      . metoprolol tartrate (LOPRESSOR) tablet 25 mg  25 mg Oral BID Shaune Pollackhen, Qing, MD   25 mg at 06/23/17 1115  . mirtazapine (REMERON) tablet 30 mg  30 mg Oral QHS Arnaldo Nataliamond, Michael S, MD      . ondansetron [facility](ZOFRAN) tablet 4 mg  4 mg Oral Q6H PRN Oralia ManisWillis, David, MD       Or  . ondansetron [facility](ZOFRAN) injection 4 mg  4  mg Intravenous Q6H PRN Oralia ManisWillis, David, MD      . pantoprazole (PROTONIX) EC tablet 40 mg  40 mg Oral Daily Oralia ManisWillis, David, MD   40 mg at 06/23/17 0844  . pravastatin (PRAVACHOL) tablet 80 mg  80 mg Oral Daily Oralia ManisWillis, David, MD   80 mg at 06/23/17 0844  . QUEtiapine (SEROQUEL) tablet 50 mg  50 mg Oral BID Shaune Pollackhen, Qing, MD   50 mg at 06/23/17 1115     Discharge Medications: Please see discharge summary for a list of discharge medications.  Relevant Imaging Results:  Relevant Lab Results:   Additional Information    Judi CongKaren M Parke Jandreau, LCSW

## 2017-06-23 NOTE — Progress Notes (Addendum)
[employer] - [place] [facility]   PATIENT NAME: Beth Leon    MR#:  [id number]  DATE OF BIRTH:  1927-09-22  SUBJECTIVE:  CHIEF COMPLAINT:   Chief Complaint  Patient presents with  . Altered Mental Status   The patient was agitated last night.  She is calm this morning.  But blood pressure is up to 192/88. REVIEW OF SYSTEMS:  Review of Systems  Unable to perform ROS: Dementia    DRUG ALLERGIES:   Allergies  Allergen Reactions  . Atorvastatin     Other reaction(s): Unknown  . Codeine Nausea And Vomiting  . Simvastatin     Other reaction(s): Unknown   VITALS:  Blood pressure (!) 163/83, pulse 78, temperature (!) 97.5 F (36.4 C), resp. rate 18, height  (1.702 m), weight 156 lb 1.4 oz (70.8 kg), SpO2 93 %. PHYSICAL EXAMINATION:  Physical Exam  Constitutional: She is well-developed, well-nourished, and in no distress.  HENT:  Head: Normocephalic.  Mouth/Throat: Oropharynx is clear and moist.  Eyes: Pupils are equal, round, and reactive to light. Conjunctivae and EOM are normal. No scleral icterus.  Neck: Normal range of motion. Neck supple. No JVD present. No tracheal deviation present.  Cardiovascular: Normal rate, regular rhythm and normal heart sounds. Exam reveals no gallop.  No murmur heard. Pulmonary/Chest: Effort normal and breath sounds normal. No respiratory distress. She has no wheezes. She has no rales.  Abdominal: Soft. Bowel sounds are normal. She exhibits no distension. There is no tenderness. There is no rebound.  Musculoskeletal: Normal range of motion. She exhibits no edema or tenderness.  Neurological: She is alert. No cranial nerve deficit.  AAOx2  Skin: No rash noted. No erythema.  Psychiatric: Affect normal.   LABORATORY PANEL:  Female CBC Recent Labs  Lab 06/22/17 0253  WBC 4.8  HGB 11.1*  HCT 35.3  PLT 130*    ------------------------------------------------------------------------------------------------------------------ Chemistries  Recent Labs  Lab 06/21/17 2055  06/23/17 0857  NA 135   < > 142  K 3.6   < > 3.4*  CL 102   < > 109  CO2 25   < > 26  GLUCOSE 105*   < > 100*  BUN 29*   < > 15  CREATININE 1.61*   < > 0.93  CALCIUM 8.8*   < > 9.4  MG  --   --  1.9  AST 48*  --   --   ALT 21  --   --   ALKPHOS 64  --   --   BILITOT 0.7  --   --    < > = values in this interval not displayed.   RADIOLOGY:  No results found. ASSESSMENT AND PLAN:   Acute encephalopathy, possible due to ARF and medication side effects.  Her increase in Lamictal dose plus the other medication she takes such as Seroquel, gabapentin, clonazepam, and Remeron could potentially be causing her symptoms.  Resume home dose due to worsening agitation and impulsive behavior.  But is still hold the Flexeril.  ARF due to dehydration. Improved with IVF.  HTN urgency -continue home meds, added clonidine p.o. as needed and IV hydralazine as needed.  Hypokalemia.  Given potassium supplement.  Magnesium is normal.   Diabetes (HCC) -sliding scale insulin with corresponding glucose checks   Hyperlipidemia -home dose antilipid   CKD (chronic kidney disease), stage III (HCC) -avoid nephrotoxins and monitor  PT: HHPT. All the records are reviewed and case discussed  with Care Management/Social Worker. Management plans discussed with the patient, her daughter and they are in agreement.  CODE STATUS: Full Code  TOTAL TIME TAKING CARE OF THIS PATIENT: 42 minutes.   More than 50% of the time was spent in counseling/coordination of care: YES  POSSIBLE D/C IN 1-2 DAYS, DEPENDING ON CLINICAL CONDITION.   Shaune Pollack M.D on 06/23/2017 at 3:59 PM  Between 7am to 6pm - Pager - [phone]  After 6pm go to [email or web address] - Therapist, nutritional Hospitalists

## 2017-06-23 NOTE — Clinical Social Work Note (Signed)
Clinical Social Work Assessment  Patient Details  Name: Beth Leon MRN: [id number] Date of Birth: 09-Nov-1927  Date of referral:  06/23/17               Reason for consult:  Facility Placement                Permission sought to share information with:  Chartered certified accountant granted to share information::  Yes, Verbal Permission Granted  Name::        Agency::  [place]  Relationship::     Contact Information:     Housing/Transportation Living arrangements for the past 2 months:  [place] of Information:  Medical Team, Facility, Siblings Patient Interpreter Needed:  None Criminal Activity/Legal Involvement Pertinent to Current Situation/Hospitalization:  No - Comment as needed Significant Relationships:  Warehouse manager, Siblings Lives with:  Facility Resident Do you feel safe going back to the place where you live?  Yes Need for family participation in patient care:  Yes (Comment)(Patient is not alert or oriented)  Care giving concerns:  Patient admitted from [facility]   Social Worker assessment / plan:  The CSW met with the patient and her sisters at bedside. The patient's daughter was not present but had given her aunts information to give to the CSW. According to the patient's siblings, the patient will return to [facility] when stable. Currently, the patient is not at baseline as she typically ambulates with a walker and is alert and oriented X2-3, but is now oriented only to self and not able to stand. PT is recommending HHPT at the facility.  The CSW will continue to follow for discharge facilitation and to watch for change in level of care needs. Discharge is unknown at this time; however, she will most likely not discharge over the weekend due to AMS and continued change of baseline.  Employment status:  Retired Nurse, adult PT Recommendations:  Not assessed at this  time Information / Referral to community resources:     Patient/Family's Response to care:  The patient's family thanked the CSW.   Patient/Family's Understanding of and Emotional Response to Diagnosis, Current Treatment, and Prognosis:  The patient's family is fully involved in the patient's care and are in agreement with return to the facility.  Emotional Assessment Appearance:  Appears stated age Attitude/Demeanor/Rapport:  Other(Patient is confused and only answers her siblings) Affect (typically observed):  Apprehensive, Restless Orientation:  Oriented to Self Alcohol / Substance use:  Never Used Psych involvement (Current and /or in the community):  No (Comment)  Discharge Needs  Concerns to be addressed:  Care Coordination, Discharge Planning Concerns Readmission within the last 30 days:  No Current discharge risk:  Cognitively Impaired Barriers to Discharge:  Continued Medical Work up   [employer], LCSW 06/23/2017, 3:20 PM

## 2017-06-23 NOTE — Progress Notes (Signed)
Saw note by written by RN Skipper ClicheSteven Imhoff.  I was not aware of psychiatry consult until called at 4pm today despite the consult of being put in earlier. So it was not a "re-contact" but a first contact made with me.  I do not know if anyone else from psychiatry was consulted about patient earlier.  However I will see patient today.

## 2017-06-23 NOTE — Evaluation (Signed)
Physical Therapy Evaluation Patient Details Name: Beth Leon DOB: 11/01/1927 Today's Date: 06/23/2017   History of Present Illness  Pt is an89 y.o.femalewho presents with lethargy and acute encephalopathy. Patient recently had some of her medication dosages increased, specifically including lamotrigine. Reportedly the patient had an episode at dinnertime at her memory care facility where she slumped over and became very somnolent. It is unclear if she had any sort of syncopal event. In the ED she was able to wake up enough to answer some simple questions, but remained very somnolent and lethargic. Initial workup was largely within normal limits. Hospitalist were called for admission and further evaluation.  Assessment includes: Acute encephalopathy with unclear etiology, essential HTN, DM, HLD, and CKD III.    Clinical Impression  Pt presents with deficits in strength, transfers, mobility, gait, balance, and activity tolerance but was primarily limited by impulsiveness and agitation with limited ability to follow commands.  Pt required extensive verbal and tactile cues for all tasks as well as min A for bed mobility and transfers.  Pt stood at EOB and took several small steps while nursing quickly changed the bed and then pt returned to supine for hygiene.  Pt's BP found to be elevated around 188/104 mmHg with nursing aware and no further attempts at amb made.  Pt will benefit from HHPT services in her memory care setting along with supervision during all OOB functional mobility upon discharge to safely address above deficits for decreased caregiver assistance and eventual return to PLOF.       Follow Up Recommendations Home health PT;Supervision for mobility/OOB    Equipment Recommendations  Rolling walker with 5" wheels(If none available at her memory care unit)    Recommendations for Other Services       Precautions / Restrictions Precautions Precautions:  Fall Restrictions Weight Bearing Restrictions: No      Mobility  Bed Mobility Overal bed mobility: Needs Assistance Bed Mobility: Supine to Sit;Sit to Supine     Supine to sit: Min assist Sit to supine: Min assist   General bed mobility comments: Pt impulsive with a history of dementia but would occasionally follow one step commands especially from her Beth Leon  Transfers Overall transfer level: Needs assistance Equipment used: Rolling walker (2 wheeled) Transfers: Sit to/from Stand Sit to Stand: Min assist         General transfer comment: Pt required min A and several attempts to stand from EOB  Ambulation/Gait Ambulation/Gait assistance: Min assist Ambulation Distance (Feet): 2 Feet Assistive device: Rolling walker (2 wheeled) Gait Pattern/deviations: Step-to pattern;Trunk flexed;Wide base of support   Gait velocity interpretation: <1.8 ft/sec, indicative of risk for recurrent falls General Gait Details: Limited amb at EOB only secondary to pt agitation with noted instability and heavy reliance on BUEs on RW  Stairs            Wheelchair Mobility    Modified Rankin (Stroke Patients Only)       Balance Overall balance assessment: Needs assistance Sitting-balance support: Feet unsupported;Bilateral upper extremity supported Sitting balance-Leahy Scale: Fair     Standing balance support: Bilateral upper extremity supported Standing balance-Leahy Scale: Poor                               Pertinent Vitals/Pain Pain Assessment: (NAD, pt unable to communicate pain level)    Home Living Family/patient expects to be discharged to:: Assisted living([facility]; Beth Leon at bedside to provide history)               Home Equipment: Dan Humphreys - 4 wheels      Prior Function Level of Independence: Needs assistance   Gait / Transfers Assistance Needed: Mod Ind with rollator in facility with frequent falls typically when pt  would forget to use her AD  ADL's / Homemaking Assistance Needed: Pt required extensive assist from memory care staff with all ADLs  Comments: Per Beth Leon pt is more agitated than normal that she attributes to recent medication changes     Hand Dominance   Dominant Hand: Right    Extremity/Trunk Assessment        Lower Extremity Assessment Lower Extremity Assessment: Generalized weakness       Communication      Cognition Arousal/Alertness: Awake/alert Behavior During Therapy: Agitated;Impulsive Overall Cognitive Status: Impaired/Different from baseline                                 General Comments: Increased agitation compared to baseline      General Comments      Exercises     Assessment/Plan    PT Assessment Patient needs continued PT services  PT Problem List Decreased strength;Decreased activity tolerance;Decreased balance;Decreased mobility;Decreased safety awareness;Decreased knowledge of use of DME       PT Treatment Interventions      PT Goals (Current goals can be found in the Care Plan section)  Acute Rehab PT Goals PT Goal Formulation: Patient unable to participate in goal setting Time For Goal Achievement: 07/06/17 Potential to Achieve Goals: Fair    Frequency Min 2X/week   Barriers to discharge        Co-evaluation               AM-PAC PT "6 Clicks" Daily Activity  Outcome Measure Difficulty turning over in bed (including adjusting bedclothes, sheets and blankets)?: Unable Difficulty moving from lying on back to sitting on the side of the bed? : Unable Difficulty sitting down on and standing up from a chair with arms (e.g., wheelchair, bedside commode, etc,.)?: Unable Help needed moving to and from a bed to chair (including a wheelchair)?: A Little Help needed walking in hospital room?: A Lot Help needed climbing 3-5 steps with a railing? : A Lot 6 Click Score: 10    End of Session Equipment Utilized During  Treatment: Gait belt Activity Tolerance: Treatment limited secondary to agitation Patient left: in bed;with bed alarm set;with family/visitor present;with nursing/sitter in room(Pt left with CNAs performing hygiene ) Nurse Communication: Mobility status PT Visit Diagnosis: Unsteadiness on feet (R26.81);Repeated falls (R29.6);Muscle weakness (generalized) (M62.81);Difficulty in walking, not elsewhere classified (R26.2)    Time: 1610-9604 PT Time Calculation (min) (ACUTE ONLY): 22 min   Charges:   PT Evaluation $PT Eval Low Complexity: 1 Low     PT G Codes:        DElly Modena PT, DPT 06/23/17, 12:49 PM

## 2017-06-24 ENCOUNTER — Other Ambulatory Visit: Payer: Self-pay

## 2017-06-24 LAB — GLUCOSE, CAPILLARY
GLUCOSE-CAPILLARY: 88 mg/dL (ref 65–99)
GLUCOSE-CAPILLARY: 117 mg/dL — AB (ref 65–99)

## 2017-06-24 MED ORDER — OLANZAPINE 5 MG PO TBDP
5.0000 mg | ORAL_TABLET | ORAL | Status: AC
  Administered 2017-06-24: 5 mg via ORAL
  Filled 2017-06-24: qty 1

## 2017-06-24 MED ORDER — HALOPERIDOL LACTATE 5 MG/ML IJ SOLN
2.0000 mg | Freq: Once | INTRAMUSCULAR | Status: AC
  Administered 2017-06-24: 2 mg via INTRAVENOUS
  Filled 2017-06-24: qty 1

## 2017-06-24 MED ORDER — OLANZAPINE 5 MG PO TBDP
5.0000 mg | ORAL_TABLET | Freq: Four times a day (QID) | ORAL | Status: DC | PRN
  Filled 2017-06-24: qty 1

## 2017-06-24 MED ORDER — DIPHENHYDRAMINE HCL 50 MG/ML IJ SOLN
12.5000 mg | Freq: Once | INTRAMUSCULAR | Status: AC
  Administered 2017-06-24: 12.5 mg via INTRAVENOUS
  Filled 2017-06-24: qty 1

## 2017-06-24 MED ORDER — CLONAZEPAM 0.5 MG PO TABS
0.5000 mg | ORAL_TABLET | ORAL | Status: AC
  Administered 2017-06-24: 0.5 mg via ORAL
  Filled 2017-06-24: qty 1

## 2017-06-24 NOTE — Plan of Care (Signed)
Patient condition unchanged, family at bedside, plan to discharge to geriatric psych as per MD note

## 2017-06-24 NOTE — Progress Notes (Signed)
Patient presently lying in the bed, agitated, restless, PRN  Haldol and abilify given for agitation not effective, family at bedside, MD paged updated about patient condition, was told to contact Psychiatric MD . New order received for Klonazepan 0.5mg  and administer. Will continue to monitor

## 2017-06-24 NOTE — Consult Note (Signed)
[facility] Psychiatry Consult   Reason for Consult:  Dementia, agitation Referring Physician:  Dr. Bridgett Larsson  Patient Identification: Beth Leon MRN:  [id number] Principal Diagnosis: Acute encephalopathy Diagnosis:   Patient Active Problem List   Diagnosis Date Noted  . Acute encephalopathy [G93.40] 06/21/2017  . CKD (chronic kidney disease), stage III ([place]) [N18.3] 06/21/2017  . Carotid stenosis [I65.29] 12/21/2016  . Essential hypertension [I10] 12/21/2016  . Diabetes ([place]) [E11.9] 12/21/2016  . Hyperlipidemia [E78.5] 12/21/2016    Total Time spent with patient: 45 minutes  Subjective:   Beth Leon is a 84 y.o. female patient admitted with encephalopathy.Marland Kitchen  HPI:   84 yo presenting with encephalopathy.    Pt in bed awake today.  Agitated overnight and got haloperidol '2mg'$ . She is talking, friendly.  AOx1, to herself but not place or time.  Daughter says at baseline she is AOx3.  Attention fluctuates.  Risk to Self: Is patient at risk for suicide?: No Risk to Others:   Prior Inpatient Therapy:   Prior Outpatient Therapy:    Past Medical History:  Past Medical History:  Diagnosis Date  . Diabetes mellitus without complication ([place])   . High cholesterol   . Hypertension     Past Surgical History:  Procedure Laterality Date  . ABDOMINAL HYSTERECTOMY    . HERNIA REPAIR     Family History:  Family History  Problem Relation Age of Onset  . Hypertension Mother   . Hyperlipidemia Mother   . Hypertension Father   . Heart attack Father   . Hyperlipidemia Father     Social History:  Social History   Substance and Sexual Activity  Alcohol Use No     Social History   Substance and Sexual Activity  Drug Use Not on file    Social History   Socioeconomic History  . Marital status: Widowed    Spouse name: Not on file  . Number of children: Not on file  . Years of education: Not on file  . Highest education level: Not on file  Occupational History  . Not  on file  Social Needs  . Financial resource strain: Not on file  . Food insecurity:    Worry: Not on file    Inability: Not on file  . Transportation needs:    Medical: Not on file    Non-medical: Not on file  Tobacco Use  . Smoking status: Former Research scientist (life sciences)  . Smokeless tobacco: Never Used  Substance and Sexual Activity  . Alcohol use: No  . Drug use: Not on file  . Sexual activity: Not on file  Lifestyle  . Physical activity:    Days per week: Not on file    Minutes per session: Not on file  . Stress: Not on file  Relationships  . Social connections:    Talks on phone: Not on file    Gets together: Not on file    Attends religious service: Not on file    Active member of club or organization: Not on file    Attends meetings of clubs or organizations: Not on file    Relationship status: Not on file  Other Topics Concern  . Not on file  Social History Narrative  . Not on file   Additional Social History:    Allergies:   Allergies  Allergen Reactions  . Atorvastatin     Other reaction(s): Unknown  . Codeine Nausea And Vomiting  . Simvastatin     Other reaction(s):  Unknown    Labs:  Results for orders placed or performed during the hospital encounter of 06/21/17 (from the past 48 hour(s))  Glucose, capillary     Status: None   Collection Time: 06/22/17 10:14 PM  Result Value Ref Range   Glucose-Capillary 92 65 - 99 mg/dL  Glucose, capillary     Status: Abnormal   Collection Time: 06/23/17  8:55 AM  Result Value Ref Range   Glucose-Capillary 63 (L) 65 - 99 mg/dL   Comment 1 Notify RN    Comment 2 Document in Chart   Basic metabolic panel     Status: Abnormal   Collection Time: 06/23/17  8:57 AM  Result Value Ref Range   Sodium 142 135 - 145 mmol/L   Potassium 3.4 (L) 3.5 - 5.1 mmol/L   Chloride 109 101 - 111 mmol/L   CO2 26 22 - 32 mmol/L   Glucose, Bld 100 (H) 65 - 99 mg/dL   BUN 15 6 - 20 mg/dL   Creatinine, Ser 0.93 0.44 - 1.00 mg/dL   Calcium 9.4 8.9  - 10.3 mg/dL   GFR calc non Af Amer 54 (L) >60 mL/min   GFR calc Af Amer >60 >60 mL/min    Comment: (NOTE) The eGFR has been calculated using the CKD EPI equation. This calculation has not been validated in all clinical situations. eGFR's persistently <60 mL/min signify possible Chronic Kidney Disease.    Anion gap 7 5 - 15    Comment: Performed at [facility], [address] [place], [place] [address]  Magnesium     Status: None   Collection Time: 06/23/17  8:57 AM  Result Value Ref Range   Magnesium 1.9 1.7 - 2.4 mg/dL    Comment: Performed at [facility], [address] [place], [place] [address]  Glucose, capillary     Status: Abnormal   Collection Time: 06/23/17  9:52 AM  Result Value Ref Range   Glucose-Capillary 100 (H) 65 - 99 mg/dL  Glucose, capillary     Status: None   Collection Time: 06/23/17 12:05 PM  Result Value Ref Range   Glucose-Capillary 81 65 - 99 mg/dL   Comment 1 Notify RN    Comment 2 Document in Chart   Glucose, capillary     Status: Abnormal   Collection Time: 06/23/17  6:32 PM  Result Value Ref Range   Glucose-Capillary 107 (H) 65 - 99 mg/dL  Glucose, capillary     Status: Abnormal   Collection Time: 06/23/17  9:13 PM  Result Value Ref Range   Glucose-Capillary 108 (H) 65 - 99 mg/dL   Comment 1 Notify RN    Comment 2 Document in Chart   Glucose, capillary     Status: None   Collection Time: 06/24/17  8:04 AM  Result Value Ref Range   Glucose-Capillary 88 65 - 99 mg/dL  Glucose, capillary     Status: Abnormal   Collection Time: 06/24/17 11:52 AM  Result Value Ref Range   Glucose-Capillary 117 (H) 65 - 99 mg/dL    Current Facility-Administered Medications  Medication Dose Route Frequency Provider Last Rate Last Dose  . acetaminophen (TYLENOL) tablet 650 mg  650 mg Oral Q6H PRN Lance Coon, MD       Or  . acetaminophen (TYLENOL) suppository 650 mg  650 mg Rectal Q6H PRN Lance Coon, MD      . ARIPiprazole (ABILIFY)  tablet 2 mg  2 mg Oral Q6H PRN Vito Berger,  Jacquelynn Cree, MD   2 mg at 06/24/17 1554  . ARIPiprazole (ABILIFY) tablet 5 mg  5 mg Oral BID Jolene Schimke, MD   5 mg at 06/24/17 1047  . aspirin EC tablet 81 mg  81 mg Oral Corwin Levins, MD   81 mg at 06/23/17 2337  . busPIRone (BUSPAR) tablet 15 mg  15 mg Oral BID Jolene Schimke, MD   15 mg at 06/24/17 1047  . clonazePAM (KLONOPIN) tablet 0.5 mg  0.5 mg Oral NOW Jolene Schimke, MD      . cloNIDine (CATAPRES) tablet 0.1 mg  0.1 mg Oral Q6H PRN Demetrios Loll, MD   0.1 mg at 06/23/17 1025  . gabapentin (NEURONTIN) capsule 300 mg  300 mg Oral BID Demetrios Loll, MD   300 mg at 06/24/17 1047  . haloperidol lactate (HALDOL) injection 2 mg  2 mg Intravenous Q6H PRN Jolene Schimke, MD   2 mg at 06/24/17 1406  . heparin injection 5,000 Units  5,000 Units Subcutaneous Camelia Phenes Lance Coon, MD   5,000 Units at 06/24/17 1359  . hydrALAZINE (APRESOLINE) injection 10 mg  10 mg Intravenous Q6H PRN Demetrios Loll, MD      . insulin aspart (novoLOG) injection 0-5 Units  0-5 Units Subcutaneous QHS Lance Coon, MD      . insulin aspart (novoLOG) injection 0-9 Units  0-9 Units Subcutaneous TID WC Lance Coon, MD      . lamoTRIgine (LAMICTAL) tablet 50 mg  50 mg Oral BID Loletha Grayer, MD   50 mg at 06/24/17 1046  . lisinopril (PRINIVIL,ZESTRIL) tablet 40 mg  40 mg Oral Daily Lance Coon, MD   40 mg at 06/24/17 1047  . Melatonin TABS 5 mg  5 mg Oral QHS Harrie Foreman, MD   5 mg at 06/23/17 2243  . metoprolol tartrate (LOPRESSOR) tablet 25 mg  25 mg Oral BID Demetrios Loll, MD   25 mg at 06/24/17 1046  . mirtazapine (REMERON) tablet 30 mg  30 mg Oral QHS Harrie Foreman, MD   30 mg at 06/23/17 2242  . ondansetron (ZOFRAN) tablet 4 mg  4 mg Oral Q6H PRN Lance Coon, MD       Or  . ondansetron [facility]) injection 4 mg  4 mg Intravenous Q6H PRN Lance Coon, MD      . pantoprazole (PROTONIX) EC tablet 40 mg  40 mg Oral Daily Lance Coon, MD   40 mg at 06/24/17 1047   . pravastatin (PRAVACHOL) tablet 80 mg  80 mg Oral Daily Lance Coon, MD   80 mg at 06/24/17 1047    Musculoskeletal: Strength & Muscle Tone: decreased Gait & Station: unable to stand Patient leans: N/A  Psychiatric Specialty Exam: Physical Exam  Review of Systems  Psychiatric/Behavioral: Positive for memory loss.    Blood pressure (!) 152/67, pulse 64, temperature (!) 97.5 F (36.4 C), temperature source Oral, resp. rate 20, height '5\' 7"'$  (1.702 m), weight 70.3 kg (155 lb), SpO2 98 %.Body mass index is 24.28 kg/m.   General Appearance: Casual  Eye Contact:  Fair  Speech:  clear coherent  Volume:  nl  Mood:  calm currently  Affect:  congruent  Thought Process: linear  Orientation: AOx1  Thought Content:  confabulating   Suicidal Thoughts:  No  Homicidal Thoughts:  No  Memory:  poor  Judgement:  Impaired  Insight:  impaired  Psychomotor Activity:  Decreased  Concentration:  Concentration: Poor  Recall:  poor  Fund of Knowledge:  poor   Language:  Fair  Akathisia:  No  Handed:  Right  AIMS (if indicated):     Assets:  social support  ADL's:  Impaired  Cognition:  Impaired  Sleep:   interrupted, poor      Treatment Plan Summary:  84 yo presenting with dementia and delirium.  Found to have ARG, HTN urgency, hypokalemic.  Dementia at baseline now with delirium overlying   Dementia +  Delirium -continue aripiprazole '5mg'$  bid.   Will give aripiprazole '2mg'$  prn agitation if pt willing to take a po -increase haloperidol to '2mg'$  IV for agitation. Does pt need IV access? If not can give haloperidol IM and remove her IV access...one less thing for her to pull at and be bothered by  -keep buspar at '15mg'$  bid (hopefully can wean off) -clonazepam 0.5 can be given if team orders, took if off PRNs b/c want to limit meds that encourage delirium, but from time to time we may need a more sedating medication.    -pls obtain EKG so QTc interval can be checked (pending)  No  major psych changes today.  I think focusing on geropsych admission will be the next step for care.   Discharge Daughter wants to know if pt can be considered for admission (preferably at a geropsych unit. This is appropriate. Per note SW is going to work on [employer] referrals  Psychiatry will follow daily. Thank you for the consult .   Jolene Schimke, MD 06/24/2017 5:06 PM

## 2017-06-24 NOTE — Progress Notes (Signed)
[facility] [place] NAME: Beth Leon    MR#:  [id number]  DATE OF BIRTH:  Sep 12, 1927  SUBJECTIVE:  CHIEF COMPLAINT:   Chief Complaint  Patient presents with  . Altered Mental Status   The patient was agitated last night and just now.  She was calm this morning.  REVIEW OF SYSTEMS:  Review of Systems  Unable to perform ROS: Dementia    DRUG ALLERGIES:   Allergies  Allergen Reactions  . Atorvastatin     Other reaction(s): Unknown  . Codeine Nausea And Vomiting  . Simvastatin     Other reaction(s): Unknown   VITALS:  Blood pressure (!) 152/67, pulse 64, temperature (!) 97.5 F (36.4 C), temperature source Oral, resp. rate 20, height 5' 7" (1.702 m), weight 155 lb (70.3 kg), SpO2 98 %. PHYSICAL EXAMINATION:  Physical Exam  Constitutional: She is well-developed, well-nourished, and in no distress.  HENT:  Head: Normocephalic.  Mouth/Throat: Oropharynx is clear and moist.  Eyes: Pupils are equal, round, and reactive to light. Conjunctivae and EOM are normal. No scleral icterus.  Neck: Normal range of motion. Neck supple. No JVD present. No tracheal deviation present.  Cardiovascular: Normal rate, regular rhythm and normal heart sounds. Exam reveals no gallop.  No murmur heard. Pulmonary/Chest: Effort normal and breath sounds normal. No respiratory distress. She has no wheezes. She has no rales.  Abdominal: Soft. Bowel sounds are normal. She exhibits no distension. There is no tenderness. There is no rebound.  Musculoskeletal: Normal range of motion. She exhibits no edema or tenderness.  Neurological: She is alert. No cranial nerve deficit.  Skin: No rash noted. No erythema.   LABORATORY PANEL:  Female CBC Recent Labs  Lab 06/22/17 0253  WBC 4.8  HGB 11.1*  HCT 35.3  PLT 130*   ------------------------------------------------------------------------------------------------------------------ Chemistries  Recent Labs    Lab 06/21/17 2055  06/23/17 0857  NA 135   < > 142  K 3.6   < > 3.4*  CL 102   < > 109  CO2 25   < > 26  GLUCOSE 105*   < > 100*  BUN 29*   < > 15  CREATININE 1.61*   < > 0.93  CALCIUM 8.8*   < > 9.4  MG  --   --  1.9  AST 48*  --   --   ALT 21  --   --   ALKPHOS 64  --   --   BILITOT 0.7  --   --    < > = values in this interval not displayed.   RADIOLOGY:  No results found. ASSESSMENT AND PLAN:   Acute encephalopathy, possible due to ARF and medication side effects.   Psychiatrist Dr. Vito Berger at the chest psych medication.  Please refer to her note. Haldol as needed for agitation.  ARF due to dehydration. Improved with IVF.  HTN urgency -continue home meds, added clonidine p.o. as needed and IV hydralazine as needed.  Hypokalemia.  Given potassium supplement.  Magnesium is normal.   Diabetes (HCC) -sliding scale insulin with corresponding glucose checks   Hyperlipidemia -home dose antilipid   CKD (chronic kidney disease), stage III ([place]) -avoid nephrotoxins and monitor  PT: HHPT. The CSW met with the patient's daughter at bedside to discuss geripsych referrals. All the records are reviewed and case discussed with Care Management/Social Worker. Management plans discussed with the patient, her daughter and they are in agreement.  CODE STATUS: Full Code  TOTAL TIME TAKING CARE OF THIS PATIENT: 32 minutes.   More than 50% of the time was spent in counseling/coordination of care: YES  POSSIBLE D/C IN 1-2 DAYS, DEPENDING ON CLINICAL CONDITION.   Demetrios Loll M.D on 06/24/2017 at 4:56 PM  Between 7am to 6pm - Pager - [phone]  After 6pm go to [email or web address] - [employer] [facility]

## 2017-06-24 NOTE — Progress Notes (Signed)
Nursing called.  Pt remains agitated. She needs a PRN that is more sedating. Will try zyprexa zydis 5mg .

## 2017-06-24 NOTE — Clinical Social Work Note (Signed)
The CSW met with the patient's HCPOA at bedside to discuss geripsych referrals. The HCPOA has agreed to the referral process beginning, and she recognizes that if the patient is not accepted by one tomorrow, she would return to her ALF/MCU until she is able to transfer to an appropriate level of care. The CSW has begun the referral process. CSW is following.  Santiago Bumpers, MSW, Latanya Presser [phone]

## 2017-06-24 NOTE — Progress Notes (Signed)
Unable to complete EKG due to patient being agitated

## 2017-06-25 ENCOUNTER — Encounter: Payer: Self-pay | Admitting: Psychiatry

## 2017-06-25 DIAGNOSIS — G9340 Encephalopathy, unspecified: Secondary | ICD-10-CM

## 2017-06-25 DIAGNOSIS — I160 Hypertensive urgency: Secondary | ICD-10-CM | POA: Diagnosis present

## 2017-06-25 DIAGNOSIS — F419 Anxiety disorder, unspecified: Secondary | ICD-10-CM | POA: Diagnosis present

## 2017-06-25 DIAGNOSIS — Z7189 Other specified counseling: Secondary | ICD-10-CM | POA: Diagnosis not present

## 2017-06-25 DIAGNOSIS — E785 Hyperlipidemia, unspecified: Secondary | ICD-10-CM | POA: Diagnosis present

## 2017-06-25 DIAGNOSIS — Z87891 Personal history of nicotine dependence: Secondary | ICD-10-CM | POA: Diagnosis not present

## 2017-06-25 DIAGNOSIS — Z8349 Family history of other endocrine, nutritional and metabolic diseases: Secondary | ICD-10-CM | POA: Diagnosis not present

## 2017-06-25 DIAGNOSIS — G301 Alzheimer's disease with late onset: Secondary | ICD-10-CM | POA: Diagnosis not present

## 2017-06-25 DIAGNOSIS — G9349 Other encephalopathy: Secondary | ICD-10-CM | POA: Diagnosis present

## 2017-06-25 DIAGNOSIS — E1142 Type 2 diabetes mellitus with diabetic polyneuropathy: Secondary | ICD-10-CM | POA: Diagnosis present

## 2017-06-25 DIAGNOSIS — E860 Dehydration: Secondary | ICD-10-CM | POA: Diagnosis present

## 2017-06-25 DIAGNOSIS — Z79899 Other long term (current) drug therapy: Secondary | ICD-10-CM | POA: Diagnosis not present

## 2017-06-25 DIAGNOSIS — Z515 Encounter for palliative care: Secondary | ICD-10-CM | POA: Diagnosis not present

## 2017-06-25 DIAGNOSIS — I129 Hypertensive chronic kidney disease with stage 1 through stage 4 chronic kidney disease, or unspecified chronic kidney disease: Secondary | ICD-10-CM | POA: Diagnosis present

## 2017-06-25 DIAGNOSIS — E7800 Pure hypercholesterolemia, unspecified: Secondary | ICD-10-CM | POA: Diagnosis present

## 2017-06-25 DIAGNOSIS — E11649 Type 2 diabetes mellitus with hypoglycemia without coma: Secondary | ICD-10-CM | POA: Diagnosis not present

## 2017-06-25 DIAGNOSIS — R410 Disorientation, unspecified: Secondary | ICD-10-CM | POA: Diagnosis not present

## 2017-06-25 DIAGNOSIS — E876 Hypokalemia: Secondary | ICD-10-CM | POA: Diagnosis present

## 2017-06-25 DIAGNOSIS — N179 Acute kidney failure, unspecified: Secondary | ICD-10-CM | POA: Diagnosis present

## 2017-06-25 DIAGNOSIS — E1122 Type 2 diabetes mellitus with diabetic chronic kidney disease: Secondary | ICD-10-CM | POA: Diagnosis present

## 2017-06-25 DIAGNOSIS — F05 Delirium due to known physiological condition: Secondary | ICD-10-CM | POA: Diagnosis present

## 2017-06-25 DIAGNOSIS — Z8249 Family history of ischemic heart disease and other diseases of the circulatory system: Secondary | ICD-10-CM | POA: Diagnosis not present

## 2017-06-25 DIAGNOSIS — Z90710 Acquired absence of both cervix and uterus: Secondary | ICD-10-CM | POA: Diagnosis not present

## 2017-06-25 DIAGNOSIS — F0150 Vascular dementia without behavioral disturbance: Secondary | ICD-10-CM | POA: Diagnosis present

## 2017-06-25 DIAGNOSIS — Z66 Do not resuscitate: Secondary | ICD-10-CM | POA: Diagnosis present

## 2017-06-25 DIAGNOSIS — F0281 Dementia in other diseases classified elsewhere with behavioral disturbance: Secondary | ICD-10-CM | POA: Diagnosis not present

## 2017-06-25 DIAGNOSIS — N183 Chronic kidney disease, stage 3 (moderate): Secondary | ICD-10-CM | POA: Diagnosis present

## 2017-06-25 DIAGNOSIS — R4182 Altered mental status, unspecified: Secondary | ICD-10-CM | POA: Diagnosis present

## 2017-06-25 LAB — ECHOCARDIOGRAM COMPLETE
HEIGHTINCHES: 67 in
WEIGHTICAEL: 2497.37 [oz_av]

## 2017-06-25 LAB — GLUCOSE, CAPILLARY
GLUCOSE-CAPILLARY: 76 mg/dL (ref 65–99)
GLUCOSE-CAPILLARY: 77 mg/dL (ref 65–99)
GLUCOSE-CAPILLARY: 66 mg/dL (ref 65–99)
GLUCOSE-CAPILLARY: 119 mg/dL — AB (ref 65–99)
Glucose-Capillary: 90 mg/dL (ref 65–99)
Glucose-Capillary: 92 mg/dL (ref 65–99)

## 2017-06-25 MED ORDER — DIPHENHYDRAMINE HCL 50 MG/ML IJ SOLN: 12.5000 mg | Freq: Once | INTRAMUSCULAR | Status: DC

## 2017-06-25 MED ORDER — QUETIAPINE FUMARATE 25 MG PO TABS
25.0000 mg | ORAL_TABLET | Freq: Two times a day (BID) | ORAL | Status: DC | PRN
  Administered 2017-06-26: 25 mg via ORAL
  Filled 2017-06-25 (×2): qty 1

## 2017-06-25 MED ORDER — QUETIAPINE FUMARATE 25 MG PO TABS
50.0000 mg | ORAL_TABLET | Freq: Two times a day (BID) | ORAL | Status: DC
  Administered 2017-06-25 – 2017-06-26 (×2): 50 mg via ORAL
  Filled 2017-06-25 (×4): qty 2

## 2017-06-25 MED ORDER — DEXTROSE-NACL 5-0.45 % IV SOLN
INTRAVENOUS | Status: DC
  Administered 2017-06-25 – 2017-06-29 (×4): via INTRAVENOUS

## 2017-06-25 MED ORDER — LORAZEPAM 2 MG/ML IJ SOLN
1.0000 mg | Freq: Once | INTRAMUSCULAR | Status: AC
  Administered 2017-06-25: 1 mg via INTRAVENOUS
  Filled 2017-06-25: qty 1

## 2017-06-25 MED ORDER — LORAZEPAM 2 MG/ML IJ SOLN
1.0000 mg | Freq: Four times a day (QID) | INTRAMUSCULAR | Status: DC | PRN
  Administered 2017-06-25 – 2017-06-27 (×5): 1 mg via INTRAVENOUS
  Filled 2017-06-25 (×5): qty 1

## 2017-06-25 NOTE — Progress Notes (Signed)
Pt woke up around 0200 agitated. PRN Haldol given and pt became more agitated and started yelling. MD Sheryle Hailiamond made aware. One time dose ativan 1 mg given. Pt became calm and fell asleep shortly afterward.

## 2017-06-25 NOTE — Progress Notes (Signed)
Talked to Dr. Anne HahnWillis about patient;s blood sugar of 66, patient has a poor oral intake today. Patient has been agitated when awake and has been sleeping when Ativan is given. Gave orange juice and jello and will recheck blood sugar in 15-20 mins. Dr. Anne HahnWillis also order D5-0.45 NS will place mitts back on to protect IV access. No other concern at the moment. RN will continue to monitor.

## 2017-06-25 NOTE — Care Management (Signed)
Patient was just assessed by phychiatry and informed CM that patient will not be discharging back to [facility] today.  Patient has delirium that is going to require medication titration which can not be one in an assist living environment.  Attending is being informed

## 2017-06-25 NOTE — Care Management (Signed)
[facility] is [facility].  Reported was receiving home health through [facility].  Obtained order  for nursing and physical therapy. Left voicemail for liaison Rosey Batheresa

## 2017-06-25 NOTE — Progress Notes (Signed)
Talked to Dr. Imogene Burnhen about patient's BP of 108/91, ok to hold metoprolol and lisinopril. Dr. Jennet MaduroPucilowska, (psych MD) was also here and discuss that she will change some of her medication, will wait until MD change medication. Asked about PRN medication such as haldol, zyprexa and clonidine if what can we give for agitation and per MD she will review medication. RN will continue to monitor.

## 2017-06-25 NOTE — Progress Notes (Signed)
Paged Dr. Imogene Burnhen and Psychiatrist on call about patient's family requesting to talk to the doctors. Dr. Imogene Burnhen says he is still rounding to 1C and will see the patient after he rounds to 1C. Re-paged psychiatrist twice, awaiting for call back. RN will continue to monitor.

## 2017-06-25 NOTE — Progress Notes (Addendum)
[facility] MD Progress Note  06/25/2017 7:10 PM Beth Leon  MRN:  [id number]  Subjective:   Beth Leon is in bed, restless, unable to participate in conversation or answer questions. Two of her daughters are in the room talking to Dr. Imogene Burn who explains that there is no obvious medical reason for hre condition. The family insists, that the patient was functioning well within memory unit being active, walking, feeding herself and receiving visitors. She had some difficulties with sundowning and getting agitated at times. This has been successfully addressed with low dose Seroquel standing and PRN and newly added Lamictal. They visit daily and feel strongly that medication adjustments offered here caused deterioration. They are asking if it is possible to reverse to preadmission regimen as no other reason for change was given than "I don;t like her medications" by providers here. Over the weekend, Dr. Vilma Prader was contacted repeatedly to treat agitation. Apparently Abilify, Haldol or Zyprexa were not helpful. Patient fell asleep last night at 3:00 am after a dose of Ativan. She has been restless today, trying to get out of bed and appeared to have visual hallucinations with pointing in the air.   Treatment plan. I will reinstate pre-admission regimen with Seroquel 50 mg BID and Seroquel 25 mg TID PRN and discontinue other antipsychotics. We will continue Lamictal 50 mg BID, BuSpar 15 mg BID and Remeron 30 mg nightly. I will use Ativan 1 mg IV PRN every 6 hours. I do realize that benzodiazepines are contraindicated in elderly but she has not slept in several days. We will discontinue Ativan ASAP. We will vigorously look for causes of delirium.  Social/disposition. She is awaiting a bed at Geropsychiatry unit. It may not be possible if delirious.   Principal Problem: Acute encephalopathy Diagnosis:   Patient Active Problem List   Diagnosis Date Noted  . Acute encephalopathy [G93.40] 06/21/2017    Priority: High   . CKD (chronic kidney disease), stage III (HCC) [N18.3] 06/21/2017  . Carotid stenosis [I65.29] 12/21/2016  . Essential hypertension [I10] 12/21/2016  . Diabetes (HCC) [E11.9] 12/21/2016  . Hyperlipidemia [E78.5] 12/21/2016   Total Time spent with patient: 1 hour  Past Psychiatric History: dementia  Past Medical History:  Past Medical History:  Diagnosis Date  . Diabetes mellitus without complication (HCC)   . High cholesterol   . Hypertension     Past Surgical History:  Procedure Laterality Date  . ABDOMINAL HYSTERECTOMY    . HERNIA REPAIR     Family History:  Family History  Problem Relation Age of Onset  . Hypertension Mother   . Hyperlipidemia Mother   . Hypertension Father   . Heart attack Father   . Hyperlipidemia Father    Family Psychiatric  History: none Social History:  Social History   Substance and Sexual Activity  Alcohol Use No     Social History   Substance and Sexual Activity  Drug Use Not on file    Social History   Socioeconomic History  . Marital status: Widowed    Spouse name: Not on file  . Number of children: Not on file  . Years of education: Not on file  . Highest education level: Not on file  Occupational History  . Not on file  Social Needs  . Financial resource strain: Not on file  . Food insecurity:    Worry: Not on file    Inability: Not on file  . Transportation needs:    Medical: Not on file  Non-medical: Not on file  Tobacco Use  . Smoking status: Former Games developer  . Smokeless tobacco: Never Used  Substance and Sexual Activity  . Alcohol use: No  . Drug use: Not on file  . Sexual activity: Not on file  Lifestyle  . Physical activity:    Days per week: Not on file    Minutes per session: Not on file  . Stress: Not on file  Relationships  . Social connections:    Talks on phone: Not on file    Gets together: Not on file    Attends religious service: Not on file    Active member of club or organization: Not on  file    Attends meetings of clubs or organizations: Not on file    Relationship status: Not on file  Other Topics Concern  . Not on file  Social History Narrative  . Not on file   Additional Social History:                         Sleep: Poor  Appetite:  Poor  Current Medications: Current Facility-Administered Medications  Medication Dose Route Frequency Provider Last Rate Last Dose  . acetaminophen (TYLENOL) tablet 650 mg  650 mg Oral Q6H PRN Oralia Manis, MD       Or  . acetaminophen (TYLENOL) suppository 650 mg  650 mg Rectal Q6H PRN Oralia Manis, MD      . aspirin EC tablet 81 mg  81 mg Oral Lamont Snowball, MD   81 mg at 06/23/17 2337  . busPIRone (BUSPAR) tablet 15 mg  15 mg Oral BID Cindee Lame, MD   15 mg at 06/24/17 1047  . cloNIDine (CATAPRES) tablet 0.1 mg  0.1 mg Oral Q6H PRN Shaune Pollack, MD   0.1 mg at 06/23/17 1025  . diphenhydrAMINE (BENADRYL) injection 12.5 mg  12.5 mg Intravenous Once Arnaldo Natal, MD      . gabapentin (NEURONTIN) capsule 300 mg  300 mg Oral BID Shaune Pollack, MD   300 mg at 06/24/17 1047  . heparin injection 5,000 Units  5,000 Units Subcutaneous Tedra Coupe Oralia Manis, MD   5,000 Units at 06/25/17 1330  . hydrALAZINE (APRESOLINE) injection 10 mg  10 mg Intravenous Q6H PRN Shaune Pollack, MD      . insulin aspart (novoLOG) injection 0-5 Units  0-5 Units Subcutaneous QHS Oralia Manis, MD      . insulin aspart (novoLOG) injection 0-9 Units  0-9 Units Subcutaneous TID WC Oralia Manis, MD      . lamoTRIgine (LAMICTAL) tablet 50 mg  50 mg Oral BID Alford Highland, MD   50 mg at 06/24/17 1046  . lisinopril (PRINIVIL,ZESTRIL) tablet 40 mg  40 mg Oral Daily Oralia Manis, MD   40 mg at 06/24/17 1047  . LORazepam (ATIVAN) injection 1 mg  1 mg Intravenous Q6H PRN Pucilowska, Jolanta B, MD   1 mg at 06/25/17 1641  . Melatonin TABS 5 mg  5 mg Oral QHS Arnaldo Natal, MD   5 mg at 06/23/17 2243  . metoprolol tartrate (LOPRESSOR) tablet 25 mg   25 mg Oral BID Shaune Pollack, MD   25 mg at 06/24/17 1046  . mirtazapine (REMERON) tablet 30 mg  30 mg Oral QHS Arnaldo Natal, MD   30 mg at 06/23/17 2242  . ondansetron (ZOFRAN) tablet 4 mg  4 mg Oral Q6H PRN Oralia Manis, MD  Or  . ondansetron (ZOFRAN) injection 4 mg  4 mg Intravenous Q6H PRN Oralia Manis, MD      . pantoprazole (PROTONIX) EC tablet 40 mg  40 mg Oral Daily Oralia Manis, MD   40 mg at 06/25/17 1331  . pravastatin (PRAVACHOL) tablet 80 mg  80 mg Oral Daily Oralia Manis, MD   80 mg at 06/25/17 1331  . QUEtiapine (SEROQUEL) tablet 25 mg  25 mg Oral BID PRN Pucilowska, Jolanta B, MD      . QUEtiapine (SEROQUEL) tablet 50 mg  50 mg Oral BID Pucilowska, Jolanta B, MD        Lab Results:  Results for orders placed or performed during the hospital encounter of 06/21/17 (from the past 48 hour(s))  Glucose, capillary     Status: Abnormal   Collection Time: 06/23/17  9:13 PM  Result Value Ref Range   Glucose-Capillary 108 (H) 65 - 99 mg/dL   Comment 1 Notify RN    Comment 2 Document in Chart   Glucose, capillary     Status: None   Collection Time: 06/24/17  8:04 AM  Result Value Ref Range   Glucose-Capillary 88 65 - 99 mg/dL  Glucose, capillary     Status: Abnormal   Collection Time: 06/24/17 11:52 AM  Result Value Ref Range   Glucose-Capillary 117 (H) 65 - 99 mg/dL  Glucose, capillary     Status: None   Collection Time: 06/25/17  1:51 AM  Result Value Ref Range   Glucose-Capillary 90 65 - 99 mg/dL  Glucose, capillary     Status: None   Collection Time: 06/25/17  7:23 AM  Result Value Ref Range   Glucose-Capillary 76 65 - 99 mg/dL  Glucose, capillary     Status: None   Collection Time: 06/25/17 11:30 AM  Result Value Ref Range   Glucose-Capillary 77 65 - 99 mg/dL  Glucose, capillary     Status: None   Collection Time: 06/25/17  5:27 PM  Result Value Ref Range   Glucose-Capillary 92 65 - 99 mg/dL    Blood Alcohol level:  No results found for:  [facility]  Metabolic Disorder Labs: Lab Results  Component Value Date   HGBA1C 5.9 (H) 06/22/2017   MPG 122.63 06/22/2017   No results found for: PROLACTIN No results found for: CHOL, TRIG, HDL, CHOLHDL, VLDL, LDLCALC  Physical Findings: AIMS:  , ,  ,  ,    CIWA:    COWS:     Musculoskeletal: Strength & Muscle Tone: within normal limits Gait & Station: unsteady Patient leans: N/A  Psychiatric Specialty Exam: Physical Exam  Nursing note and vitals reviewed. Psychiatric: Her affect is blunt. She is hyperactive. Cognition and memory are impaired. She expresses inappropriate judgment. She is noncommunicative.    Review of Systems  Unable to perform ROS: Dementia    Blood pressure (!) 157/104, pulse (!) 59, temperature 98.3 F (36.8 C), resp. rate 18, height 5\' 7"  (1.702 m), weight 68.9 kg (152 lb), SpO2 99 %.Body mass index is 23.81 kg/m.  General Appearance: Disheveled  Eye Contact:  Minimal  Speech:  Blocked  Volume:  NA  Mood:  Irritable  Affect:  Blunt  Thought Process:  NA  Orientation:  NA  Thought Content:  NA  Suicidal Thoughts:  unable to assess  Homicidal Thoughts:  No  Memory:  Immediate;   Poor Recent;   Poor Remote;   Poor  Judgement:  Poor  Insight:  Lacking  Psychomotor  Activity:  Increased  Concentration:  Concentration: Poor and Attention Span: Poor  Recall:  Poor  Fund of Knowledge:  Poor  Language:  Poor  Akathisia:  No  Handed:  Right  AIMS (if indicated):     Assets:  Financial Resources/Insurance Housing Resilience Social Support  ADL's:  Impaired  Cognition:  Impaired,  Severe  Sleep:        Treatment Plan Summary: Daily contact with patient to assess and evaluate symptoms and progress in treatment and Medication management   PLAN: See above.  Kristine LineaJolanta Pucilowska, MD 06/25/2017, 7:10 PM

## 2017-06-25 NOTE — Progress Notes (Signed)
[facility] [place] at [facility]   PATIENT NAME: Beth Leon    MR#:  [id number]  DATE OF BIRTH:  10/04/1927  SUBJECTIVE:  CHIEF COMPLAINT:   Chief Complaint  Patient presents with  . Altered Mental Status   The patient has agitation on and off, especially during the night.  The psychiatrist saw the patient today, suggested that the patient has delirium.  She suggested resuming the patient's home psych medication.Marland Kitchen. REVIEW OF SYSTEMS:  Review of Systems  Unable to perform ROS: Dementia    DRUG ALLERGIES:   Allergies  Allergen Reactions  . Atorvastatin     Other reaction(s): Unknown  . Codeine Nausea And Vomiting  . Simvastatin     Other reaction(s): Unknown   VITALS:  Blood pressure (!) 108/91, pulse 80, temperature 98.3 F (36.8 C), resp. rate 18, height 5\' 7"  (1.702 m), weight 152 lb (68.9 kg), SpO2 98 %. PHYSICAL EXAMINATION:  Physical Exam  HENT:  Head: Normocephalic.  Eyes: No scleral icterus.  Neck: Neck supple. No JVD present. No tracheal deviation present.  Cardiovascular: Normal rate, regular rhythm and normal heart sounds. Exam reveals no gallop.  No murmur heard. Pulmonary/Chest: Effort normal and breath sounds normal. No respiratory distress. She has no wheezes. She has no rales.  Abdominal: Soft. Bowel sounds are normal. She exhibits no distension. There is no tenderness.  Musculoskeletal: She exhibits no edema or tenderness.  Skin: No rash noted. No erythema.   LABORATORY PANEL:  Female CBC Recent Labs  Lab 06/22/17 0253  WBC 4.8  HGB 11.1*  HCT 35.3  PLT 130*   ------------------------------------------------------------------------------------------------------------------ Chemistries  Recent Labs  Lab 06/21/17 2055  06/23/17 0857  NA 135   < > 142  K 3.6   < > 3.4*  CL 102   < > 109  CO2 25   < > 26  GLUCOSE 105*   < > 100*  BUN 29*   < > 15  CREATININE 1.61*   < > 0.93  CALCIUM 8.8*   < > 9.4  MG  --   --  1.9   AST 48*  --   --   ALT 21  --   --   ALKPHOS 64  --   --   BILITOT 0.7  --   --    < > = values in this interval not displayed.   RADIOLOGY:  No results found. ASSESSMENT AND PLAN:   Acute encephalopathy and delirium, due to underlying dementia and the psych medications. Per Dr. Jennet MaduroPucilowska, the patient has delirium, can also be discharged to anywhere this time, resumed her home psych medication.  Haldol as needed.  ARF on CKD stage 3, due to dehydration. Improved with IVF.  HTN urgency -continue home meds, added clonidine p.o. as needed and IV hydralazine as needed. Improved.  Hypokalemia.  Given potassium supplement.  Magnesium is normal.   Diabetes (HCC) -sliding scale insulin with corresponding glucose checks   Hyperlipidemia -home dose antilipid  Discussed with Dr. Jennet MaduroPucilowska. Patient still does not have any geripsych bed offers. All the records are reviewed and case discussed with Care Management/Social Worker. Management plans discussed with the patient, her daughters and they are in agreement.  CODE STATUS: Full Code  TOTAL TIME TAKING CARE OF THIS PATIENT: 48 minutes.   More than 50% of the time was spent in counseling/coordination of care: YES  POSSIBLE D/C IN 2 DAYS, DEPENDING ON CLINICAL CONDITION.   Beth Leon  M.D on 06/25/2017 at 4:58 PM  Between 7am to 6pm - Pager - [phone]  After 6pm go to [email or web address] - Therapist, nutritional Hospitalists

## 2017-06-25 NOTE — Progress Notes (Signed)
Patient agitated and refusing PO medications at this time. Family requesting something for agitation. Per night shift RN, the haldol last night made her worse, but ativan helped. Family wants to try ativan. Paged Dr. Imogene Burnhen - instructed to speak with psych doctor on-call for an order. Paged Dr. Jennet MaduroPucilowska, waiting for call back. Will retry daily PO meds when patient is less agitated per family request.

## 2017-06-25 NOTE — Progress Notes (Signed)
Talked to Dr. Jennet MaduroPucilowska about patient's increase agitation, 1mg  IV ativan given. RN will continue to monitor.

## 2017-06-25 NOTE — Clinical Social Work Note (Signed)
Patient still does not have any geripsych bed offers.  [facility] [place] [facility], and she said that if patient can not get placed at a geripsych they will accept her back tomorrow in the memory care section at [facility].  Ervin KnackEric R. Jermani Eberlein, MSW, Theresia MajorsLCSWA [phone]  06/25/2017 4:31 PM

## 2017-06-26 LAB — CBC
HCT: 32.9 % — ABNORMAL LOW (ref 35.0–47.0)
HEMOGLOBIN: 11 g/dL — AB (ref 12.0–16.0)
MCH: 29 pg (ref 26.0–34.0)
MCHC: 33.4 g/dL (ref 32.0–36.0)
MCV: 86.8 fL (ref 80.0–100.0)
PLATELETS: 155 10*3/uL (ref 150–440)
RBC: 3.79 MIL/uL — AB (ref 3.80–5.20)
RDW: 16 % — ABNORMAL HIGH (ref 11.5–14.5)
WBC: 6.3 10*3/uL (ref 3.6–11.0)

## 2017-06-26 LAB — GLUCOSE, CAPILLARY
GLUCOSE-CAPILLARY: 95 mg/dL (ref 65–99)
GLUCOSE-CAPILLARY: 88 mg/dL (ref 65–99)
GLUCOSE-CAPILLARY: 92 mg/dL (ref 65–99)
Glucose-Capillary: 95 mg/dL (ref 65–99)
Glucose-Capillary: 121 mg/dL — ABNORMAL HIGH (ref 65–99)

## 2017-06-26 NOTE — Plan of Care (Signed)
  Problem: Education: Goal: Knowledge of General Education information will improve Outcome: Progressing   Problem: Clinical Measurements: Goal: Ability to maintain clinical measurements within normal limits will improve Outcome: Progressing Goal: Will remain free from infection Outcome: Progressing Goal: Diagnostic test results will improve Outcome: Progressing Goal: Respiratory complications will improve Outcome: Progressing Goal: Cardiovascular complication will be avoided Outcome: Progressing   Problem: Activity: Goal: Risk for activity intolerance will decrease Outcome: Progressing   Problem: Elimination: Goal: Will not experience complications related to bowel motility Outcome: Progressing Goal: Will not experience complications related to urinary retention Outcome: Progressing   Problem: Pain Managment: Goal: General experience of comfort will improve Outcome: Progressing   Problem: Skin Integrity: Goal: Risk for impaired skin integrity will decrease Outcome: Progressing

## 2017-06-26 NOTE — Progress Notes (Signed)
PT Cancellation Note  Patient Details Name: Beth Leon MRN: [id number] DOB: 08/28/1927   Cancelled Treatment:    Reason Eval/Treat Not Completed: Fatigue/lethargy limiting ability to participate   Attempted x 2 this am.  Pt asleep and daughter in.  Unable to awaken at this time for session.  Will return after lunch to attempt.     Danielle DessSarah Nahsir Venezia 06/26/2017, 11:52 AM

## 2017-06-26 NOTE — Plan of Care (Signed)
  Problem: Clinical Measurements: Goal: Respiratory complications will improve Outcome: Progressing   Problem: Activity: Goal: Risk for activity intolerance will decrease Outcome: Progressing   Problem: Coping: Goal: Level of anxiety will decrease Outcome: Progressing   Problem: Pain Managment: Goal: General experience of comfort will improve Outcome: Progressing   Problem: Safety: Goal: Ability to remain free from injury will improve Outcome: Progressing   Problem: Skin Integrity: Goal: Risk for impaired skin integrity will decrease Outcome: Progressing   

## 2017-06-26 NOTE — Progress Notes (Signed)
Physical Therapy Treatment Patient Details Name: Beth Leon MRN: [id number] DOB: 07/03/1927 Today's Date: 06/26/2017    History of Present Illness Pt is an89 y.o.femalewho presents with lethargy and acute encephalopathy. Patient recently had some of her medication dosages increased, specifically including lamotrigine. Reportedly the patient had an episode at dinnertime at her memory care facility where she slumped over and became very somnolent. It is unclear if she had any sort of syncopal event. In the ED she was able to wake up enough to answer some simple questions, but remained very somnolent and lethargic. Initial workup was largely within normal limits. Hospitalist were called for admission and further evaluation.  Assessment includes: Acute encephalopathy with unclear etiology, essential HTN, DM, HLD, and CKD III.    PT Comments    Daughter in room.  Pt lethargic but less agitated.  Agreed to session.  Max/dependant assist to edge of bed where she remained sitting x 10 minutes in attempt to awaken.  Required mod assist to remain sitting.  Standing and transfers not appropriate at this time.  Returned to supine dependant and fell quickly to sleep.  Pt was noted to have a mouthful of potatoes in her mouth upon arrival.  Attempted to clear but will call nurse tech for mouth care.  She was unable to sip on straw.  Will discuss speech consult with primary nurse.   Follow Up Recommendations  SNF     Equipment Recommendations       Recommendations for Other Services       Precautions / Restrictions Precautions Precautions: Fall Restrictions Weight Bearing Restrictions: No    Mobility  Bed Mobility Overal bed mobility: Needs Assistance Bed Mobility: Supine to Sit;Sit to Supine     Supine to sit: Total assist Sit to supine: Total assist      Transfers Overall transfer level: Needs assistance               General transfer comment: unable due to  lethargy  Ambulation/Gait             General Gait Details: unable due to lethargy   Stairs            Wheelchair Mobility    Modified Rankin (Stroke Patients Only)       Balance Overall balance assessment: Needs assistance Sitting-balance support: Feet supported;Bilateral upper extremity supported Sitting balance-Leahy Scale: Zero       Standing balance-Leahy Scale: Zero                              Cognition Arousal/Alertness: Lethargic                                            Exercises Other Exercises Other Exercises: sat edge of bed x 10 minutes with mod support in attemtps to awaken for tranfer/mobility.    General Comments        Pertinent Vitals/Pain Pain Assessment: No/denies pain    Home Living                      Prior Function            PT Goals (current goals can now be found in the care plan section) Progress towards PT goals: Not progressing toward goals - comment    Frequency  Min 2X/week      PT Plan Discharge plan needs to be updated    Co-evaluation              AM-PAC PT "6 Clicks" Daily Activity  Outcome Measure  Difficulty turning over in bed (including adjusting bedclothes, sheets and blankets)?: Unable Difficulty moving from lying on back to sitting on the side of the bed? : Unable Difficulty sitting down on and standing up from a chair with arms (e.g., wheelchair, bedside commode, etc,.)?: Unable Help needed moving to and from a bed to chair (including a wheelchair)?: Total Help needed walking in hospital room?: Total Help needed climbing 3-5 steps with a railing? : Total 6 Click Score: 6    End of Session   Activity Tolerance: Patient limited by lethargy Patient left: in bed;with bed alarm set;with call bell/phone within reach;with family/visitor present         Time: 1610-9604 PT Time Calculation (min) (ACUTE ONLY): 18 min  Charges:  $Therapeutic  Activity: 8-22 mins                    G Codes:      Danielle Dess, PTA 06/26/17, 2:41 PM

## 2017-06-26 NOTE — Progress Notes (Addendum)
PT Cancellation Note  Patient Details Name: Beth Leon MRN: [id number] DOB: 01/05/1928   Cancelled Treatment:    Reason Eval/Treat Not Completed: Other (comment)   Pt awake this attempt.  Agitated at attempt at mobility.  Pt pushing hands away during attempts to remove blankets and fix attends pad.  Voiced several times that she wanted me to leave.  Tried to re-direct pt and encouraged out of bed for lunch but she continued to refuse.  Primary nurse in and aware.  Discussed with SWS and discharge planning.  During discussion it is generally thought that pt may be more suited for SNF/rehab for discharge plan.  Will continue and make recommendations as appropriate.  Danielle DessSarah Breiana Stratmann 06/26/2017, 2:00 PM

## 2017-06-26 NOTE — Progress Notes (Signed)
[facility] MD Progress Note  06/26/2017 7:49 PM Beth Leon HUSBAND  MRN:  [id number]  Subjective:   [name] received a single dose of 1 mg of Ativan at 16:00 yesterday and was able to sleep through the night. She was also asleep this ap after morning dose of Seroquel. She has not been able to work with PT effectively today and their assessment indicates that nursing home is the most appropriate setting for this patient. The family hopes that the patient wil, return to her memory unit and baseline. In the evening, the patient is restless, tries to get out of bed, and attempts to remove mittens. She is however able to answer simple questions appropriately. She tells me that she is in pain but unable to name the hurting part. She has not eaten today at all.   Treatment plan. The family insists on keeping her outpatient regimen of low dose seroquel and lamictal. The daughters were very upset with any changes so I am not going to change anything. In general in delirious patient less is more. I spoke with the nurse to give the patient another Ativan dose tonight. Per nursing report yesterday, she did not respond to Haldol os Zyprexa.  Social/disposition. Apparently her old facility will assess her tomorrow.  Principal Problem: Acute encephalopathy Diagnosis:   Patient Active Problem List   Diagnosis Date Noted  . Acute encephalopathy [G93.40] 06/21/2017    Priority: High  . CKD (chronic kidney disease), stage III (HCC) [N18.3] 06/21/2017  . Carotid stenosis [I65.29] 12/21/2016  . Essential hypertension [I10] 12/21/2016  . Diabetes (HCC) [E11.9] 12/21/2016  . Hyperlipidemia [E78.5] 12/21/2016   Total Time spent with patient: 30 minutes  Past Psychiatric History: dementia  Past Medical History:  Past Medical History:  Diagnosis Date  . Diabetes mellitus without complication (HCC)   . High cholesterol   . Hypertension     Past Surgical History:  Procedure Laterality Date  . ABDOMINAL HYSTERECTOMY     . HERNIA REPAIR     Family History:  Family History  Problem Relation Age of Onset  . Hypertension Mother   . Hyperlipidemia Mother   . Hypertension Father   . Heart attack Father   . Hyperlipidemia Father    Family Psychiatric  History: none Social History:  Social History   Substance and Sexual Activity  Alcohol Use No     Social History   Substance and Sexual Activity  Drug Use Not on file    Social History   Socioeconomic History  . Marital status: Widowed    Spouse name: Not on file  . Number of children: Not on file  . Years of education: Not on file  . Highest education level: Not on file  Occupational History  . Not on file  Social Needs  . Financial resource strain: Not on file  . Food insecurity:    Worry: Not on file    Inability: Not on file  . Transportation needs:    Medical: Not on file    Non-medical: Not on file  Tobacco Use  . Smoking status: Former Games developer  . Smokeless tobacco: Never Used  Substance and Sexual Activity  . Alcohol use: No  . Drug use: Not on file  . Sexual activity: Not on file  Lifestyle  . Physical activity:    Days per week: Not on file    Minutes per session: Not on file  . Stress: Not on file  Relationships  . Social connections:  Talks on phone: Not on file    Gets together: Not on file    Attends religious service: Not on file    Active member of club or organization: Not on file    Attends meetings of clubs or organizations: Not on file    Relationship status: Not on file  Other Topics Concern  . Not on file  Social History Narrative  . Not on file   Additional Social History:                         Sleep: Poor  Appetite:  Poor  Current Medications: Current Facility-Administered Medications  Medication Dose Route Frequency Provider Last Rate Last Dose  . acetaminophen (TYLENOL) tablet 650 mg  650 mg Oral Q6H PRN Oralia Manis, MD       Or  . acetaminophen (TYLENOL) suppository 650 mg   650 mg Rectal Q6H PRN Oralia Manis, MD      . aspirin EC tablet 81 mg  81 mg Oral Lamont Snowball, MD   81 mg at 06/25/17 2106  . busPIRone (BUSPAR) tablet 15 mg  15 mg Oral BID Cindee Lame, MD   15 mg at 06/26/17 1024  . cloNIDine (CATAPRES) tablet 0.1 mg  0.1 mg Oral Q6H PRN Shaune Pollack, MD   0.1 mg at 06/23/17 1025  . dextrose 5 %-0.45 % sodium chloride infusion   Intravenous Continuous Oralia Manis, MD 50 mL/hr at 06/26/17 1743    . diphenhydrAMINE (BENADRYL) injection 12.5 mg  12.5 mg Intravenous Once Arnaldo Natal, MD      . gabapentin (NEURONTIN) capsule 300 mg  300 mg Oral BID Shaune Pollack, MD   300 mg at 06/26/17 1024  . heparin injection 5,000 Units  5,000 Units Subcutaneous Willow Ora, MD   5,000 Units at 06/26/17 1337  . hydrALAZINE (APRESOLINE) injection 10 mg  10 mg Intravenous Q6H PRN Shaune Pollack, MD      . insulin aspart (novoLOG) injection 0-5 Units  0-5 Units Subcutaneous QHS Oralia Manis, MD      . insulin aspart (novoLOG) injection 0-9 Units  0-9 Units Subcutaneous TID WC Oralia Manis, MD   1 Units at 06/26/17 1231  . lamoTRIgine (LAMICTAL) tablet 50 mg  50 mg Oral BID Alford Highland, MD   50 mg at 06/26/17 1023  . lisinopril (PRINIVIL,ZESTRIL) tablet 40 mg  40 mg Oral Daily Oralia Manis, MD   40 mg at 06/26/17 1022  . LORazepam (ATIVAN) injection 1 mg  1 mg Intravenous Q6H PRN Mirage Pfefferkorn B, MD   1 mg at 06/26/17 1942  . Melatonin TABS 5 mg  5 mg Oral QHS Arnaldo Natal, MD   5 mg at 06/25/17 2106  . metoprolol tartrate (LOPRESSOR) tablet 25 mg  25 mg Oral BID Shaune Pollack, MD   25 mg at 06/26/17 1024  . mirtazapine (REMERON) tablet 30 mg  30 mg Oral QHS Arnaldo Natal, MD   30 mg at 06/25/17 2104  . ondansetron (ZOFRAN) tablet 4 mg  4 mg Oral Q6H PRN Oralia Manis, MD       Or  . ondansetron [facility]) injection 4 mg  4 mg Intravenous Q6H PRN Oralia Manis, MD      . pantoprazole (PROTONIX) EC tablet 40 mg  40 mg Oral Daily Oralia Manis,  MD   40 mg at 06/26/17 1023  . pravastatin (PRAVACHOL) tablet 80 mg  80 mg Oral  Daily Oralia ManisWillis, David, MD   80 mg at 06/26/17 1024  . QUEtiapine (SEROQUEL) tablet 25 mg  25 mg Oral BID PRN Khamani Daniely B, MD   25 mg at 06/26/17 1837  . QUEtiapine (SEROQUEL) tablet 50 mg  50 mg Oral BID Shonn Farruggia B, MD   50 mg at 06/26/17 1023    Lab Results:  Results for orders placed or performed during the hospital encounter of 06/21/17 (from the past 48 hour(s))  Glucose, capillary     Status: None   Collection Time: 06/25/17  1:51 AM  Result Value Ref Range   Glucose-Capillary 90 65 - 99 mg/dL  Glucose, capillary     Status: None   Collection Time: 06/25/17  7:23 AM  Result Value Ref Range   Glucose-Capillary 76 65 - 99 mg/dL  Glucose, capillary     Status: None   Collection Time: 06/25/17 11:30 AM  Result Value Ref Range   Glucose-Capillary 77 65 - 99 mg/dL  Glucose, capillary     Status: None   Collection Time: 06/25/17  5:27 PM  Result Value Ref Range   Glucose-Capillary 92 65 - 99 mg/dL  Glucose, capillary     Status: None   Collection Time: 06/25/17  9:04 PM  Result Value Ref Range   Glucose-Capillary 66 65 - 99 mg/dL  Glucose, capillary     Status: Abnormal   Collection Time: 06/25/17  9:48 PM  Result Value Ref Range   Glucose-Capillary 119 (H) 65 - 99 mg/dL  Glucose, capillary     Status: None   Collection Time: 06/26/17  3:02 AM  Result Value Ref Range   Glucose-Capillary 95 65 - 99 mg/dL  CBC     Status: Abnormal   Collection Time: 06/26/17  4:47 AM  Result Value Ref Range   WBC 6.3 3.6 - 11.0 K/uL   RBC 3.79 (L) 3.80 - 5.20 MIL/uL   Hemoglobin 11.0 (L) 12.0 - 16.0 g/dL   HCT 11.932.9 (L) 14.735.0 - 82.947.0 %   MCV 86.8 80.0 - 100.0 fL   MCH 29.0 26.0 - 34.0 pg   MCHC 33.4 32.0 - 36.0 g/dL   RDW 56.216.0 (H) 13.011.5 - 86.514.5 %   Platelets 155 150 - 440 K/uL    Comment: Performed at [facility], [address] [place], [place] [address]  Glucose, capillary      Status: None   Collection Time: 06/26/17  7:24 AM  Result Value Ref Range   Glucose-Capillary 95 65 - 99 mg/dL  Glucose, capillary     Status: Abnormal   Collection Time: 06/26/17 12:00 PM  Result Value Ref Range   Glucose-Capillary 121 (H) 65 - 99 mg/dL  Glucose, capillary     Status: None   Collection Time: 06/26/17  4:51 PM  Result Value Ref Range   Glucose-Capillary 88 65 - 99 mg/dL    Blood Alcohol level:  No results found for: [facility]  Metabolic Disorder Labs: Lab Results  Component Value Date   HGBA1C 5.9 (H) 06/22/2017   MPG 122.63 06/22/2017   No results found for: PROLACTIN No results found for: CHOL, TRIG, HDL, CHOLHDL, VLDL, LDLCALC  Physical Findings: AIMS:  , ,  ,  ,    CIWA:    COWS:     Musculoskeletal: Strength & Muscle Tone: decreased Gait & Station: unable to stand Patient leans: N/A  Psychiatric Specialty Exam: Physical Exam  Nursing note and vitals reviewed. Psychiatric: Her mood appears anxious.  Her speech is delayed. She is hyperactive. Cognition and memory are impaired. She expresses inappropriate judgment.    Review of Systems  Unable to perform ROS: Mental status change  Neurological: Negative.   Psychiatric/Behavioral: Positive for hallucinations. The patient has insomnia.     Blood pressure (!) 147/51, pulse (!) 43, temperature 98.8 F (37.1 C), temperature source Oral, resp. rate 18, height 5\' 4"  (1.626 m), weight 64.9 kg (143 lb), SpO2 98 %.Body mass index is 24.55 kg/m.  General Appearance: Fairly Groomed  Eye Contact:  None  Speech:  Slow  Volume:  Decreased  Mood:  Irritable  Affect:  Inappropriate  Thought Process:  NA  Orientation:  NA  Thought Content:  NA  Suicidal Thoughts:  No  Homicidal Thoughts:  No  Memory:  Immediate;   Poor Recent;   Poor Remote;   Poor  Judgement:  Poor  Insight:  Lacking  Psychomotor Activity:  Increased  Concentration:  Concentration: Poor and Attention Span: Poor  Recall:  Poor  Fund of  Knowledge:  Poor  Language:  Poor  Akathisia:  No  Handed:  Right  AIMS (if indicated):     Assets:  Desire for Improvement Financial Resources/Insurance Resilience Social Support  ADL's:  Impaired  Cognition:  Impaired,  Moderate  Sleep:        Treatment Plan Summary: Daily contact with patient to assess and evaluate symptoms and progress in treatment and Medication management   PLAN: 1. Please continue Seroquel, Remeron and Lamictal per family wishes  2. Ativan 1 mg IV is available for agitation.  3. Will follow along.  Kristine Linea, MD 06/26/2017, 7:49 PM

## 2017-06-26 NOTE — Progress Notes (Addendum)
[facility] [place] [facility]   PATIENT NAME: Beth Leon    MR#:  [id number]  DATE OF BIRTH:  Dec 27, 1927  SUBJECTIVE:  CHIEF COMPLAINT:   Chief Complaint  Patient presents with  . Altered Mental Status   The patient has agitation on and off, especially during the night.  She has poor oral intake and had hypoglycemia this am. On D5 IV. REVIEW OF SYSTEMS:  Review of Systems  Unable to perform ROS: Dementia    DRUG ALLERGIES:   Allergies  Allergen Reactions  . Atorvastatin     Other reaction(s): Unknown  . Codeine Nausea And Vomiting  . Simvastatin     Other reaction(s): Unknown   VITALS:  Blood pressure (!) 147/51, pulse (!) 43, temperature 98.8 F (37.1 C), temperature source Oral, resp. rate 18, height 5\' 4"  (1.626 m), weight 143 lb (64.9 kg), SpO2 98 %. PHYSICAL EXAMINATION:  Physical Exam  HENT:  Head: Normocephalic.  Eyes: No scleral icterus.  Neck: Neck supple. No JVD present. No tracheal deviation present.  Cardiovascular: Normal rate, regular rhythm and normal heart sounds. Exam reveals no gallop.  No murmur heard. Pulmonary/Chest: Effort normal and breath sounds normal. No respiratory distress. She has no wheezes. She has no rales.  Abdominal: Soft. Bowel sounds are normal. She exhibits no distension. There is no tenderness.  Musculoskeletal: She exhibits no edema or tenderness.  Skin: No rash noted. No erythema.   LABORATORY PANEL:  Female CBC Recent Labs  Lab 06/26/17 0447  WBC 6.3  HGB 11.0*  HCT 32.9*  PLT 155   ------------------------------------------------------------------------------------------------------------------ Chemistries  Recent Labs  Lab 06/21/17 2055  06/23/17 0857  NA 135   < > 142  K 3.6   < > 3.4*  CL 102   < > 109  CO2 25   < > 26  GLUCOSE 105*   < > 100*  BUN 29*   < > 15  CREATININE 1.61*   < > 0.93  CALCIUM 8.8*   < > 9.4  MG  --   --  1.9  AST 48*  --   --   ALT 21  --   --   ALKPHOS  64  --   --   BILITOT 0.7  --   --    < > = values in this interval not displayed.   RADIOLOGY:  No results found. ASSESSMENT AND PLAN:   Acute encephalopathy and delirium, due to underlying dementia and the psych medications. Per Dr. Jennet Maduro, the patient has delirium, can also be discharged to anywhere this time, resumed her home psych medication.  Haldol as needed.  ARF on CKD stage 3, due to dehydration. Improved with IVF.  HTN urgency -continue home meds, added clonidine p.o. as needed and IV hydralazine as needed. Improved.  Hypokalemia.  Given potassium supplement.  Magnesium is normal.   Diabetes (HCC) -sliding scale insulin with corresponding glucose checks   Hyperlipidemia -home dose antilipid  Hypoglycemia.  Improved with D5 IV.  Discussed with Dr. Jennet Maduro. PT evaluation: SNF All the records are reviewed and case discussed with Care Management/Social Worker. Management plans discussed with the patient, her daughters and they are in agreement.  CODE STATUS: Full Code  TOTAL TIME TAKING CARE OF THIS PATIENT: 36 minutes.   More than 50% of the time was spent in counseling/coordination of care: YES  POSSIBLE D/C IN 2 DAYS, DEPENDING ON CLINICAL CONDITION.   Shaune Pollack M.D on 06/26/2017  at 4:14 PM  Between 7am to 6pm - Pager - [phone]  After 6pm go to [email or web address] - Therapist, nutritionalpassword [email or web address]  [facility] [place] [facility]

## 2017-06-27 DIAGNOSIS — G301 Alzheimer's disease with late onset: Secondary | ICD-10-CM

## 2017-06-27 DIAGNOSIS — R410 Disorientation, unspecified: Secondary | ICD-10-CM

## 2017-06-27 DIAGNOSIS — Z515 Encounter for palliative care: Secondary | ICD-10-CM

## 2017-06-27 DIAGNOSIS — F0281 Dementia in other diseases classified elsewhere with behavioral disturbance: Secondary | ICD-10-CM

## 2017-06-27 DIAGNOSIS — Z7189 Other specified counseling: Secondary | ICD-10-CM

## 2017-06-27 LAB — GLUCOSE, CAPILLARY
GLUCOSE-CAPILLARY: 109 mg/dL — AB (ref 65–99)
GLUCOSE-CAPILLARY: 119 mg/dL — AB (ref 65–99)
GLUCOSE-CAPILLARY: 113 mg/dL — AB (ref 65–99)
GLUCOSE-CAPILLARY: 120 mg/dL — AB (ref 65–99)
Glucose-Capillary: 128 mg/dL — ABNORMAL HIGH (ref 65–99)

## 2017-06-27 MED ORDER — LORAZEPAM 2 MG/ML IJ SOLN
0.5000 mg | INTRAMUSCULAR | Status: DC | PRN
  Administered 2017-06-27: 21:00:00 1 mg via INTRAVENOUS
  Administered 2017-06-27: 16:00:00 0.5 mg via INTRAVENOUS
  Administered 2017-06-28 – 2017-06-29 (×4): 1 mg via INTRAVENOUS
  Filled 2017-06-27 (×6): qty 1

## 2017-06-27 MED ORDER — OLANZAPINE 5 MG PO TBDP
5.0000 mg | ORAL_TABLET | Freq: Three times a day (TID) | ORAL | Status: DC
  Filled 2017-06-27 (×2): qty 1

## 2017-06-27 MED ORDER — MIRTAZAPINE 15 MG PO TBDP
15.0000 mg | ORAL_TABLET | Freq: Every day | ORAL | Status: DC
  Filled 2017-06-27 (×2): qty 1

## 2017-06-27 MED ORDER — OLANZAPINE 5 MG PO TBDP: 5.0000 mg | ORAL_TABLET | Freq: Three times a day (TID) | ORAL | Status: DC

## 2017-06-27 MED ORDER — OLANZAPINE 5 MG PO TBDP
5.0000 mg | ORAL_TABLET | Freq: Three times a day (TID) | ORAL | Status: DC | PRN
  Administered 2017-06-27: 5 mg via ORAL
  Filled 2017-06-27 (×2): qty 1

## 2017-06-27 NOTE — Clinical Social Work Note (Signed)
CSW spoke with Beth Leon from [facility], she will assess patient on Wednesday to decide if they are able to accept patient.  Patient's family would like SNF placement if [facility] not able to accept her.  CSW has began insurance authorization, and [facility].  [facility] any [facility] available for [facility].  CSW updated patient's family.  Beth Leon, MSW, Beth MajorsLCSWA [phone]  06/27/2017 8:25 AM

## 2017-06-27 NOTE — Progress Notes (Signed)
PT Cancellation Note  Patient Details Name: Beth Leon MRN: [id number] DOB: 05/09/1927   Cancelled Treatment:    Reason Eval/Treat Not Completed: Fatigue/lethargy limiting ability to participate   Pt remains lethargic and unable to participate this pm.  Will continue as appropriate.   Danielle DessSarah Kacia Halley 06/27/2017, 4:28 PM

## 2017-06-27 NOTE — Progress Notes (Signed)
PT Cancellation Note  Patient Details Name: Beth Leon MRN: [id number] DOB: 04/11/1927   Cancelled Treatment:    Reason Eval/Treat Not Completed: Fatigue/lethargy limiting ability to participate   Unable to participate this am due to lethargy.  Daughter in room.  Left phone number and she will call if she awakens before I return again for attempt this pm.   Danielle DessSarah Ohn Bostic 06/27/2017, 11:41 AM

## 2017-06-27 NOTE — [place] FL2 (Addendum)
[place] MEDICAID FL2 LEVEL OF CARE SCREENING TOOL     IDENTIFICATION  Patient Name: Beth Leon Birthdate: 01/04/1928 Sex: female Admission Date (Current Location): 06/21/2017  [place] and [place] Number:  Chiropodist and Address:  [facility], [address], [place], [place] [address]      Provider Number: [id number]  Attending Physician Name and Address:  Shaune Pollack, MD  Relative Name and Phone Number:  [name] (Daughter/HCPOA): [phone]    Current Level of Care: Hospital Recommended Level of Care: Skilled Nursing Facility Prior Approval Number:    Date Approved/Denied:   PASRR Number: [id number]  Discharge Plan: SNF    Current Diagnoses: Patient Active Problem List   Diagnosis Date Noted  . Acute encephalopathy 06/21/2017  . CKD (chronic kidney disease), stage III (HCC) 06/21/2017  . Carotid stenosis 12/21/2016  . Essential hypertension 12/21/2016  . Diabetes (HCC) 12/21/2016  . Hyperlipidemia 12/21/2016    Orientation RESPIRATION BLADDER Height & Weight     Self  Normal Incontinent Weight: 151 lb (68.5 kg) Height:  5\' 4"  (162.6 cm)  BEHAVIORAL SYMPTOMS/MOOD NEUROLOGICAL BOWEL NUTRITION STATUS  Other (Comment)(Sometimes has agitation.)   Incontinent Diet  AMBULATORY STATUS COMMUNICATION OF NEEDS Skin   Limited Assist Verbally Normal                       Personal Care Assistance Level of Assistance  Bathing, Feeding, Dressing Bathing Assistance: Limited assistance Feeding assistance: Limited assistance Dressing Assistance: Limited assistance     Functional Limitations Info  Sight, Hearing, Speech Sight Info: Adequate Hearing Info: Adequate Speech Info: Adequate    SPECIAL CARE FACTORS FREQUENCY  PT (By licensed PT)     PT Frequency: 5x a week              Contractures Contractures Info: Not present    Additional Factors Info  Allergies, Psychotropic, Code Status, Insulin Sliding  Scale Code Status Info: Full Code Allergies Info: ATORVASTATIN, CODEINE, SIMVASTATIN  Psychotropic Info: busPIRone (BUSPAR) tablet 15 mg and mirtazapine (REMERON) tablet 30 mg  Insulin Sliding Scale Info: insulin aspart (novoLOG) injection 0-5 Units 3x a day with meals       Current Medications (06/27/2017):  This is the current hospital active medication list Current Facility-Administered Medications  Medication Dose Route Frequency Provider Last Rate Last Dose  . acetaminophen (TYLENOL) tablet 650 mg  650 mg Oral Q6H PRN Oralia Manis, MD       Or  . acetaminophen (TYLENOL) suppository 650 mg  650 mg Rectal Q6H PRN Oralia Manis, MD      . aspirin EC tablet 81 mg  81 mg Oral Lamont Snowball, MD   81 mg at 06/25/17 2106  . busPIRone (BUSPAR) tablet 15 mg  15 mg Oral BID Cindee Lame, MD   15 mg at 06/26/17 1024  . cloNIDine (CATAPRES) tablet 0.1 mg  0.1 mg Oral Q6H PRN Shaune Pollack, MD   0.1 mg at 06/23/17 1025  . dextrose 5 %-0.45 % sodium chloride infusion   Intravenous Continuous Oralia Manis, MD 50 mL/hr at 06/26/17 1743    . diphenhydrAMINE (BENADRYL) injection 12.5 mg  12.5 mg Intravenous Once Arnaldo Natal, MD      . gabapentin (NEURONTIN) capsule 300 mg  300 mg Oral BID Shaune Pollack, MD   300 mg at 06/26/17 1024  . heparin injection 5,000 Units  5,000 Units Subcutaneous Q8H Oralia Manis, MD  5,000 Units at 06/27/17 [phone]  . hydrALAZINE (APRESOLINE) injection 10 mg  10 mg Intravenous Q6H PRN Shaune Pollackhen, Qing, MD   10 mg at 06/27/17 [phone]  . insulin aspart (novoLOG) injection 0-5 Units  0-5 Units Subcutaneous QHS Oralia ManisWillis, David, MD      . insulin aspart (novoLOG) injection 0-9 Units  0-9 Units Subcutaneous TID WC Oralia ManisWillis, David, MD   1 Units at 06/27/17 [phone]  . lamoTRIgine (LAMICTAL) tablet 50 mg  50 mg Oral BID Alford HighlandWieting, Richard, MD   50 mg at 06/26/17 1023  . lisinopril (PRINIVIL,ZESTRIL) tablet 40 mg  40 mg Oral Daily Oralia ManisWillis, David, MD   40 mg at 06/26/17 1022  . LORazepam (ATIVAN)  injection 1 mg  1 mg Intravenous Q6H PRN Pucilowska, Jolanta B, MD   1 mg at 06/27/17 0806  . Melatonin TABS 5 mg  5 mg Oral QHS Arnaldo Nataliamond, Michael S, MD   5 mg at 06/25/17 2106  . metoprolol tartrate (LOPRESSOR) tablet 25 mg  25 mg Oral BID Shaune Pollackhen, Qing, MD   25 mg at 06/26/17 1024  . mirtazapine (REMERON) tablet 30 mg  30 mg Oral QHS Arnaldo Nataliamond, Michael S, MD   30 mg at 06/25/17 2104  . ondansetron (ZOFRAN) tablet 4 mg  4 mg Oral Q6H PRN Oralia ManisWillis, David, MD       Or  . ondansetron [facility](ZOFRAN) injection 4 mg  4 mg Intravenous Q6H PRN Oralia ManisWillis, David, MD      . pantoprazole (PROTONIX) EC tablet 40 mg  40 mg Oral Daily Oralia ManisWillis, David, MD   40 mg at 06/26/17 1023  . pravastatin (PRAVACHOL) tablet 80 mg  80 mg Oral Daily Oralia ManisWillis, David, MD   80 mg at 06/26/17 1024  . QUEtiapine (SEROQUEL) tablet 25 mg  25 mg Oral BID PRN Pucilowska, Jolanta B, MD   25 mg at 06/26/17 1837  . QUEtiapine (SEROQUEL) tablet 50 mg  50 mg Oral BID Pucilowska, Jolanta B, MD   50 mg at 06/26/17 1023     Discharge Medications: Please see discharge summary for a list of discharge medications.  Relevant Imaging Results:  Relevant Lab Results:   Additional Information SSN [id number]  Darleene Cleavernterhaus, Davian Wollenberg R, [place]

## 2017-06-27 NOTE — Progress Notes (Signed)
PT Cancellation Note  Patient Details Name: Beth Leon MRN: [id number] DOB: 10/28/1927   Cancelled Treatment:    Reason Eval/Treat Not Completed: Fatigue/lethargy limiting ability to participate; Patient slightly opened eyes with verbal and tactile stimulation but would not move any body part, speak, or follow any commands, nursing notified.  Will attempt to see pt at a future date/time as medically appropriate.    Ovidio Hanger. Scott Tymeshia Awan PT, DPT 06/27/17, 11:38 AM

## 2017-06-27 NOTE — Progress Notes (Signed)
[facility]  06/27/2017 4:25 PM Beth AmbleLala C Mattern  MRN:  [id number]  Subjective:   [name] was restless last night and received single injection of Ativan. She slept until noon this morning. She is again restless, trying to take her mittens off, pulling on her diaper and trying to get out of bed. She responds to the simplest questions but appropriately. She was unabkle to work with PT in spite of multiple attempts. She did not take oral medications last night or this morning according to her daughter. She has not been eating or drinking. Family in distress awaiting visit from her memory unit nurse to see if she could go back as medicine is ready to discharge her. As the daughter told me that the quality of life is very important to the family, I suggested palliative care consult to answer family's questions. Her daughter was interested.   Spoke with Dr. Imogene Burnhen and SW.  Treatment plan. The patient has been on a combination of Seroquel, remeron, Buspar and Lamictal. She has not been able to take oral medications. I will discontinue all and switch to soluble remeron. She sleep with an IV dose of Ativan. This is not good for dementia and increases fall risk but she needs rest. We will offer Zyprexa zydis for agitation.  Social/disposition. TBE.   Principal Problem: Acute encephalopathy Diagnosis:   Patient Active Problem List   Diagnosis Date Noted  . Acute encephalopathy [G93.40] 06/21/2017    Priority: High  . CKD (chronic kidney disease), stage III (HCC) [N18.3] 06/21/2017  . Carotid stenosis [I65.29] 12/21/2016  . Essential hypertension [I10] 12/21/2016  . Diabetes (HCC) [E11.9] 12/21/2016  . Hyperlipidemia [E78.5] 12/21/2016   Total Time spent with patient: 30 minutes  Past Psychiatric History: dementia  Past Medical History:  Past Medical History:  Diagnosis Date  . Diabetes mellitus without complication (HCC)   . High cholesterol   . Hypertension     Past Surgical  History:  Procedure Laterality Date  . ABDOMINAL HYSTERECTOMY    . HERNIA REPAIR     Family History:  Family History  Problem Relation Age of Onset  . Hypertension Mother   . Hyperlipidemia Mother   . Hypertension Father   . Heart attack Father   . Hyperlipidemia Father    Family Psychiatric  History: none Social History:  Social History   Substance and Sexual Activity  Alcohol Use No     Social History   Substance and Sexual Activity  Drug Use Not on file    Social History   Socioeconomic History  . Marital status: Widowed    Spouse name: Not on file  . Number of children: Not on file  . Years of education: Not on file  . Highest education level: Not on file  Occupational History  . Not on file  Social Needs  . Financial resource strain: Not on file  . Food insecurity:    Worry: Not on file    Inability: Not on file  . Transportation needs:    Medical: Not on file    Non-medical: Not on file  Tobacco Use  . Smoking status: Former Games developermoker  . Smokeless tobacco: Never Used  Substance and Sexual Activity  . Alcohol use: No  . Drug use: Not on file  . Sexual activity: Not on file  Lifestyle  . Physical activity:    Days per week: Not on file    Minutes per session: Not on file  .  Stress: Not on file  Relationships  . Social connections:    Talks on phone: Not on file    Gets together: Not on file    Attends religious service: Not on file    Active member of club or organization: Not on file    Attends meetings of clubs or organizations: Not on file    Relationship status: Not on file  Other Topics Concern  . Not on file  Social History Narrative  . Not on file   Additional Social History:                         Sleep: Poor  Appetite:  Poor  Current Medications: Current Facility-Administered Medications  Medication Dose Route Frequency Provider Last Rate Last Dose  . acetaminophen (TYLENOL) tablet 650 mg  650 mg Oral Q6H PRN Oralia Manis, MD       Or  . acetaminophen (TYLENOL) suppository 650 mg  650 mg Rectal Q6H PRN Oralia Manis, MD      . aspirin EC tablet 81 mg  81 mg Oral Lamont Snowball, MD   81 mg at 06/25/17 2106  . busPIRone (BUSPAR) tablet 15 mg  15 mg Oral BID Cindee Lame, MD   Stopped at 06/27/17 [phone]  . cloNIDine (CATAPRES) tablet 0.1 mg  0.1 mg Oral Q6H PRN Shaune Pollack, MD   0.1 mg at 06/23/17 1025  . dextrose 5 %-0.45 % sodium chloride infusion   Intravenous Continuous Oralia Manis, MD 50 mL/hr at 06/26/17 1743    . diphenhydrAMINE (BENADRYL) injection 12.5 mg  12.5 mg Intravenous Once Arnaldo Natal, MD      . gabapentin (NEURONTIN) capsule 300 mg  300 mg Oral BID Shaune Pollack, MD   Stopped at 06/27/17 1000  . heparin injection 5,000 Units  5,000 Units Subcutaneous Willow Ora, MD   5,000 Units at 06/27/17 1352  . hydrALAZINE (APRESOLINE) injection 10 mg  10 mg Intravenous Q6H PRN Shaune Pollack, MD   10 mg at 06/27/17 [phone]  . insulin aspart (novoLOG) injection 0-5 Units  0-5 Units Subcutaneous QHS Oralia Manis, MD      . insulin aspart (novoLOG) injection 0-9 Units  0-9 Units Subcutaneous TID WC Oralia Manis, MD   1 Units at 06/27/17 [phone]  . lisinopril (PRINIVIL,ZESTRIL) tablet 40 mg  40 mg Oral Daily Oralia Manis, MD   Stopped at 06/27/17 1000  . LORazepam (ATIVAN) injection 0.5-1 mg  0.5-1 mg Intravenous Q4H PRN Ulice Bold, NP   0.5 mg at 06/27/17 1610  . Melatonin TABS 5 mg  5 mg Oral QHS Arnaldo Natal, MD   5 mg at 06/25/17 2106  . metoprolol tartrate (LOPRESSOR) tablet 25 mg  25 mg Oral BID Shaune Pollack, MD   Stopped at 06/27/17 1000  . mirtazapine (REMERON SOL-TAB) disintegrating tablet 15 mg  15 mg Oral QHS Akiel Fennell B, MD      . OLANZapine zydis (ZYPREXA) disintegrating tablet 5 mg  5 mg Oral TID PRN Jelani Vreeland B, MD      . ondansetron (ZOFRAN) tablet 4 mg  4 mg Oral Q6H PRN Oralia Manis, MD       Or  . ondansetron [facility]) injection 4 mg  4 mg  Intravenous Q6H PRN Oralia Manis, MD      . pantoprazole (PROTONIX) EC tablet 40 mg  40 mg Oral Daily Oralia Manis, MD   Stopped at 06/27/17 1000  .  pravastatin (PRAVACHOL) tablet 80 mg  80 mg Oral Daily Oralia Manis, MD   Stopped at 06/27/17 1001    Lab Results:  Results for orders placed or performed during the hospital encounter of 06/21/17 (from the past 48 hour(s))  Glucose, capillary     Status: None   Collection Time: 06/25/17  5:27 PM  Result Value Ref Range   Glucose-Capillary 92 65 - 99 mg/dL  Glucose, capillary     Status: None   Collection Time: 06/25/17  9:04 PM  Result Value Ref Range   Glucose-Capillary 66 65 - 99 mg/dL  Glucose, capillary     Status: Abnormal   Collection Time: 06/25/17  9:48 PM  Result Value Ref Range   Glucose-Capillary 119 (H) 65 - 99 mg/dL  Glucose, capillary     Status: None   Collection Time: 06/26/17  3:02 AM  Result Value Ref Range   Glucose-Capillary 95 65 - 99 mg/dL  CBC     Status: Abnormal   Collection Time: 06/26/17  4:47 AM  Result Value Ref Range   WBC 6.3 3.6 - 11.0 K/uL   RBC 3.79 (L) 3.80 - 5.20 MIL/uL   Hemoglobin 11.0 (L) 12.0 - 16.0 g/dL   HCT 40.9 (L) 81.1 - 91.4 %   MCV 86.8 80.0 - 100.0 fL   MCH 29.0 26.0 - 34.0 pg   MCHC 33.4 32.0 - 36.0 g/dL   RDW 78.2 (H) 95.6 - 21.3 %   Platelets 155 150 - 440 K/uL    Comment: Performed at [facility], [address] [place], [place] [address]  Glucose, capillary     Status: None   Collection Time: 06/26/17  7:24 AM  Result Value Ref Range   Glucose-Capillary 95 65 - 99 mg/dL  Glucose, capillary     Status: Abnormal   Collection Time: 06/26/17 12:00 PM  Result Value Ref Range   Glucose-Capillary 121 (H) 65 - 99 mg/dL  Glucose, capillary     Status: None   Collection Time: 06/26/17  4:51 PM  Result Value Ref Range   Glucose-Capillary 88 65 - 99 mg/dL  Glucose, capillary     Status: None   Collection Time: 06/26/17 10:04 PM  Result Value Ref Range    Glucose-Capillary 92 65 - 99 mg/dL  Glucose, capillary     Status: Abnormal   Collection Time: 06/27/17  3:39 AM  Result Value Ref Range   Glucose-Capillary 109 (H) 65 - 99 mg/dL  Glucose, capillary     Status: Abnormal   Collection Time: 06/27/17  7:53 AM  Result Value Ref Range   Glucose-Capillary 128 (H) 65 - 99 mg/dL  Glucose, capillary     Status: Abnormal   Collection Time: 06/27/17 11:52 AM  Result Value Ref Range   Glucose-Capillary 119 (H) 65 - 99 mg/dL    Blood Alcohol level:  No results found for: [facility]  Metabolic Disorder Labs: Lab Results  Component Value Date   HGBA1C 5.9 (H) 06/22/2017   MPG 122.63 06/22/2017   No results found for: PROLACTIN No results found for: CHOL, TRIG, HDL, CHOLHDL, VLDL, LDLCALC  Physical Findings: AIMS:  , ,  ,  ,    CIWA:    COWS:     Musculoskeletal: Strength & Muscle Tone: decreased Gait & Station: unable to stand Patient leans: N/A  Psychiatric Specialty Exam: Physical Exam  Vitals reviewed. Psychiatric: Her affect is inappropriate. Her speech is delayed and slurred. She is hyperactive. Cognition  and memory are impaired. She expresses inappropriate judgment.    Review of Systems  Unable to perform ROS: Mental status change  All other systems reviewed and are negative.   Blood pressure (!) 158/80, pulse (!) 105, temperature 99.3 F (37.4 C), temperature source Axillary, resp. rate 18, height 5\' 4"  (1.626 m), weight 68.5 kg (151 lb), SpO2 100 %.Body mass index is 25.92 kg/m.  General Appearance: Disheveled  Eye Contact:  None  Speech:  Blocked and Slow  Volume:  Decreased  Mood:  Anxious  Affect:  Congruent  Thought Process:  NA  Orientation:  NA  Thought Content:  NA  Suicidal Thoughts:  No  Homicidal Thoughts:  No  Memory:  Immediate;   Poor Recent;   Poor Remote;   Poor  Judgement:  Poor  Insight:  Lacking  Psychomotor Activity:  Increased  Concentration:  Concentration: Poor and Attention Span: Poor   Recall:  Poor  Fund of Knowledge:  Poor  Language:  Poor  Akathisia:  No  Handed:  Right  AIMS (if indicated):     Assets:  Social Support  ADL's:  Impaired  Cognition:  Impaired,  Severe  Sleep:        Treatment Plan Summary: Daily contact with patient to assess and evaluate symptoms and progress in treatment and Medication management   PLAN: 1. Discontinue oral psychotropic medications except Remeron solu tab 15 mg nightly.  2. Offer Zyprexa zydis 5 mg for restlessness.  3. I will follow along.  Kristine Linea, MD 06/27/2017, 4:25 PM

## 2017-06-27 NOTE — Consult Note (Signed)
Consultation Note Date: 06/27/2017   Patient Name: Beth Leon  DOB: [birth year]  MRN: [id number]  Age / Sex: 84 y.o., female  PCP: Adin Hector, MD Referring Physician: Demetrios Loll, MD  Reason for Consultation: Establishing goals of care  HPI/Patient Profile: 85 y.o. female admitted on 06/21/2017 from [employer] with lethargy and acute encephalopathy. She has a past medical history significant for diabetes, HLD, hypertension, stage 3 kidney disease, and Mixed Alzheimer's and vascular dementia. Patient recently had some of her medication dosages increased, specifically including lamotrigine by her neurologist (Dr. Manuella Ghazi).  In ED patient was unable to provide history, and information was taken collaterally from nursing facility where she resides.  Reportedly the patient had an episode at dinnertime where she slumped over and became very somnolent.  It is unclear if she had any sort of syncopal event.  Here in the ED she is able to wake up enough to answer some simple questions, but remains very somnolent and lethargic.  Initial workup in ED is largely within normal limits. During the course of her admission she has become more agitated and unable to take in very little by mouth due to confusion. Palliative Medicine consulted for goals of care discussion.   Clinical Assessment and Goals of Care: I have reviewed medical records including lab results, imaging, Epic notes, and MAR, received report from the bedside RN, and assessed the patient. I then met at the bedside along with Sheela Stack (daughter/HCPOA)  to discuss diagnosis prognosis, GOC, EOL wishes, disposition and options.  I introduced Palliative Medicine as specialized medical care for people living with serious illness. It focuses on providing relief from the symptoms and stress of a serious illness. The goal is to improve quality of life for both  the patient and the family. Ms. Smedley has 2 daughters who both live locally.   As far as functional and nutritional status her family reports prior to hospital admission she was able to feed herself mainly with finger foods. She did require assistance with other ADLs. She has lived at [facility] facility since January due to a significant decline in her health status and advancement of dementia. Family reports prior to she was living in an assisted living. They have noticed over the past several months that her appetite has not been the best but she would eat several meals and snacks during the day. She was ambulatory independently with a walker also prior to admission.   We discussed her current illness and what it means in the larger context of her on-going co-morbidities.  Natural disease trajectory and expectations at EOL were discussed. Her family is very reasonable with a clear understanding of Ms. Ollis's dementia process. They understand her condition and the long-term outcome.  I attempted to elicit values and goals of care important to the patient.    The difference between aggressive medical intervention and comfort care was considered in light of the patient's goals of care. At this time the family  felt that if this was going to be their mother's baseline with continuous agitation, pulling at things such as linen, restless, and unable to eat or drink due to no interest that they would be openly accepting of this and would rather her be kept comfortable and treated with dignity during her transition. However, at this time they feel it is important for her to be seen by her Neurologist and have them weigh in as they feel her medications have been adjusted multiple times during this admission. They want to be certain that her behavior is not resulting from this before making a decision to shift to comfort care. Family very clear that their top priority is her comfort - even if this  means she is sedated and restful and leads to EOL rather than restless and agitated.    Advanced directives, concepts specific to code status, artifical feeding and hydration, and rehospitalization were considered and discussed. Family verbalized having conversations and documented wishes from patient that she would not want any life-sustaining measures such as CPR, dialysis, intubation, or artificial feedings. Family has requested for her to be a DNR/DNI.   Hospice and Palliative Care services outpatient were explained. Family verbalized understanding and stated once they receive an evaluation from neurology they can make a decision were to go with her care.   Questions and concerns were addressed.  The family was encouraged to call with questions or concerns.  PMT will continue to support holistically. Discussed with RN and Dr. Bridgett Larsson who plans to consult neurology.    HCPOA-Robin Tamala Julian (daughter)     SUMMARY OF RECOMMENDATIONS    DNR/DNI  Continue to treat the treatable per family's request  Would consider Neurology consult per family's request. If possible they would like their family neurologist Dr. Manuella Ghazi or his NP being they have been following her outpatient and familiar with patient's health condition and baseline.  Agree with CSW to continue to seek appropriate placement in preparation for disposition.   Ativan 0.5-8m IV PRN for agitation/anxiety. Patient is not tolerating po or medications at this time. Family would rather her be comfortable than to observe her continuously agitated throughout the day/night.   Code Status/Advance Care Planning:  DNR/DNI per family's request and advanced directives    Palliative Prophylaxis:   Aspiration, Bowel Regimen, Delirium Protocol, Frequent Pain Assessment, Oral Care and Turn Reposition  Additional Recommendations (Limitations, Scope, Preferences):  Full Scope Treatment-continue treating the treatable per family's request    Psycho-social/Spiritual:   Desire for further Chaplaincy support:NO   Prognosis:   Unable to determine  Discharge Planning: To Be Determined      Primary Diagnoses: Present on Admission: . Essential hypertension . Hyperlipidemia . Acute encephalopathy . CKD (chronic kidney disease), stage III ([place]   I have reviewed the medical record, interviewed the patient and family, and examined the patient. The following aspects are pertinent.  Past Medical History:  Diagnosis Date  . Diabetes mellitus without complication ([place]   . High cholesterol   . Hypertension    Social History   Socioeconomic History  . Marital status: Widowed    Spouse name: Not on file  . Number of children: Not on file  . Years of education: Not on file  . Highest education level: Not on file  Occupational History  . Not on file  Social Needs  . Financial resource strain: Not on file  . Food insecurity:    Worry: Not on file    Inability: Not on  file  . Transportation needs:    Medical: Not on file    Non-medical: Not on file  Tobacco Use  . Smoking status: Former Research scientist (life sciences)  . Smokeless tobacco: Never Used  Substance and Sexual Activity  . Alcohol use: No  . Drug use: Not on file  . Sexual activity: Not on file  Lifestyle  . Physical activity:    Days per week: Not on file    Minutes per session: Not on file  . Stress: Not on file  Relationships  . Social connections:    Talks on phone: Not on file    Gets together: Not on file    Attends religious service: Not on file    Active member of club or organization: Not on file    Attends meetings of clubs or organizations: Not on file    Relationship status: Not on file  Other Topics Concern  . Not on file  Social History Narrative  . Not on file   Family History  Problem Relation Age of Onset  . Hypertension Mother   . Hyperlipidemia Mother   . Hypertension Father   . Heart attack Father   . Hyperlipidemia Father    Scheduled  Meds: . aspirin EC  81 mg Oral QHS  . busPIRone  15 mg Oral BID  . diphenhydrAMINE  12.5 mg Intravenous Once  . gabapentin  300 mg Oral BID  . heparin  5,000 Units Subcutaneous Q8H  . insulin aspart  0-5 Units Subcutaneous QHS  . insulin aspart  0-9 Units Subcutaneous TID WC  . lisinopril  40 mg Oral Daily  . Melatonin  5 mg Oral QHS  . metoprolol tartrate  25 mg Oral BID  . mirtazapine  15 mg Oral QHS  . pantoprazole  40 mg Oral Daily  . pravastatin  80 mg Oral Daily   Continuous Infusions: . dextrose 5 % and 0.45% NaCl 50 mL/hr at 06/26/17 1743   PRN Meds:.acetaminophen **OR** acetaminophen, cloNIDine, hydrALAZINE, LORazepam, OLANZapine zydis, ondansetron **OR** ondansetron (ZOFRAN) IV Medications Prior to Admission:  Prior to Admission medications   Medication Sig Start Date End Date Taking? Authorizing Provider  acetaminophen (TYLENOL) 500 MG tablet Take 1,000 mg by mouth 2 (two) times daily.    Yes [provider]  aspirin EC 81 MG tablet Take 81 mg by mouth at bedtime.   Yes [provider]  busPIRone (BUSPAR) 30 MG tablet Take 30 mg by mouth 2 (two) times daily. 05/11/17  Yes [provider]  clonazePAM (KLONOPIN) 0.5 MG tablet Take 0.5 mg by mouth daily as needed (agitation).   Yes [provider]  cyanocobalamin 1000 MCG tablet Take 500 mcg by mouth daily.   Yes [provider]  cyclobenzaprine (FLEXERIL) 10 MG tablet Take 1 tablet (10 mg total) by mouth at bedtime. 03/09/16  Yes Merlyn Lot, MD  gabapentin (NEURONTIN) 300 MG capsule Take 300 mg by mouth 2 (two) times daily.   Yes [provider]  lamoTRIgine (LAMICTAL) 25 MG tablet Take 50 mg by mouth 2 (two) times daily.   Yes [provider]  lisinopril (PRINIVIL,ZESTRIL) 40 MG tablet Take 40 mg by mouth daily.   Yes [provider]  loperamide (IMODIUM) 2 MG capsule Take 2 mg by mouth 4 (four) times daily as needed for diarrhea or loose stools.    Yes [provider]  Melatonin 5 MG TABS Take 5 mg by mouth at bedtime.   Yes [provider]  mirtazapine (REMERON) 30 MG tablet Take 30 mg by mouth at bedtime.   Yes [provider]  omeprazole (PRILOSEC) 20 MG capsule Take 20 mg by mouth daily.   Yes [provider]  pravastatin (PRAVACHOL) 80 MG tablet Take 80 mg by mouth daily.    Yes [provider]  QUEtiapine (SEROQUEL) 25 MG tablet Take 1 tablet (25 mg total) by mouth every 6 (six) hours as needed (agitation). 05/15/17  Yes Clapacs, Madie Reno, MD  QUEtiapine (SEROQUEL) 50 MG tablet Take 1 tablet (50 mg total) by mouth 2 (two) times daily. 05/15/17  Yes Clapacs, Madie Reno, MD  traMADol (ULTRAM) 50 MG tablet Take 50 mg by mouth every 8 (eight) hours as needed for moderate pain.    Yes [provider]   Allergies  Allergen Reactions  . Atorvastatin     Other reaction(s): Unknown  . Codeine Nausea And Vomiting  . Simvastatin     Other reaction(s): Unknown   Review of Systems  Unable to perform ROS: Acuity of condition    Physical Exam  Constitutional: Vital signs are normal. She is uncooperative. She appears ill.  Restless, continuously fidgeting with covers    Cardiovascular: Normal rate, regular rhythm and intact distal pulses.  Musculoskeletal:  Generalized weakness   Neurological: She is disoriented.  Skin: Skin is warm, dry and intact.  Some scattered bruising   Psychiatric: She is agitated. Cognition and memory are impaired.  Hx of advanced dementia    Vital Signs: BP (!) 158/80 (BP Location: Right Arm)   Pulse (!) 105   Temp 99.3 F (37.4 C) (Axillary)   Resp 18   Ht _0  (1.626 m)   Wt 68.5 kg (151 lb)   SpO2 100%   BMI 25.92 kg/m  Pain Scale: PAINAD POSS *See Group Information*: S-Acceptable,Sleep, easy to arouse Pain Score: Asleep   SpO2: SpO2: 100 % O2 Device:SpO2: 100 % O2 Flow Rate: .O2 Flow Rate (L/min): 2 L/min  IO: Intake/output summary:    Intake/Output Summary (Last 24 hours) at 06/27/2017 1632 Last data filed at 06/27/2017 1400 Gross per 24 hour  Intake 1767.67 ml  Output -  Net 1767.67 ml    LBM: Last BM Date: 06/24/17 Baseline Weight: Weight: 68 kg (150 lb) Most recent weight: Weight: 68.5 kg (151 lb)     Palliative Assessment/Data:PPS 30%    Time In: 1500 Time Out: 1615 Time Total: 75 min   Greater than 50%  of this time was spent counseling and coordinating care related to the above assessment and plan.  Signed by:  Alda Lea, NP-BC Palliative Medicine Team  Phone: [phone] Fax: [phone]  Vinie Sill, NP Palliative Medicine Team Pager # [phone] (M-F 8a-5p) Team Phone # [phone] (Nights/Weekends)   Please contact Palliative Medicine Team phone at [phone] for questions and concerns.  For individual provider: See Shea Evans

## 2017-06-28 LAB — GLUCOSE, CAPILLARY
GLUCOSE-CAPILLARY: 112 mg/dL — AB (ref 65–99)
GLUCOSE-CAPILLARY: 129 mg/dL — AB (ref 65–99)
GLUCOSE-CAPILLARY: 105 mg/dL — AB (ref 65–99)
Glucose-Capillary: 110 mg/dL — ABNORMAL HIGH (ref 65–99)

## 2017-06-28 MED ORDER — LORAZEPAM 2 MG/ML IJ SOLN: 1.0000 mg | Freq: Three times a day (TID) | INTRAMUSCULAR | Status: DC

## 2017-06-28 MED ORDER — ORAL CARE MOUTH RINSE
15.0000 mL | Freq: Two times a day (BID) | OROMUCOSAL | Status: DC
  Administered 2017-06-29 – 2017-07-01 (×5): 15 mL via OROMUCOSAL

## 2017-06-28 MED ORDER — POLYVINYL ALCOHOL 1.4 % OP SOLN
1.0000 [drp] | Freq: Four times a day (QID) | OPHTHALMIC | Status: DC | PRN
  Filled 2017-06-28: qty 15

## 2017-06-28 MED ORDER — MORPHINE SULFATE (CONCENTRATE) 10 MG/0.5ML PO SOLN
5.0000 mg | ORAL | Status: DC | PRN
Start: 2017-06-28 — End: 2017-07-01
  Administered 2017-07-01: 14:00:00 5 mg via SUBLINGUAL
  Filled 2017-06-28: qty 1

## 2017-06-28 MED ORDER — BIOTENE DRY MOUTH MT LIQD: 15.0000 mL | OROMUCOSAL | Status: DC | PRN

## 2017-06-28 MED ORDER — LORAZEPAM 2 MG/ML IJ SOLN
1.0000 mg | Freq: Three times a day (TID) | INTRAMUSCULAR | Status: DC
  Administered 2017-06-29 – 2017-07-01 (×8): 1 mg via INTRAVENOUS
  Filled 2017-06-28 (×8): qty 1

## 2017-06-28 MED ORDER — GLYCOPYRROLATE 0.2 MG/ML IJ SOLN
0.2000 mg | INTRAMUSCULAR | Status: DC | PRN
  Filled 2017-06-28 (×3): qty 1

## 2017-06-28 NOTE — Plan of Care (Signed)
  Problem: Education: Goal: Knowledge of General Education information will improve Outcome: Not Progressing   Problem: Health Behavior/Discharge Planning: Goal: Ability to manage health-related needs will improve Outcome: Not Progressing   Problem: Clinical Measurements: Goal: Ability to maintain clinical measurements within normal limits will improve Outcome: Progressing Goal: Will remain free from infection Outcome: Progressing Goal: Diagnostic test results will improve Outcome: Not Progressing Goal: Respiratory complications will improve Outcome: Progressing Goal: Cardiovascular complication will be avoided Outcome: Not Progressing   Problem: Activity: Goal: Risk for activity intolerance will decrease Outcome: Not Progressing   Problem: Nutrition: Goal: Adequate nutrition will be maintained Outcome: Not Progressing   Problem: Coping: Goal: Level of anxiety will decrease Outcome: Progressing   Problem: Elimination: Goal: Will not experience complications related to bowel motility Outcome: Progressing Goal: Will not experience complications related to urinary retention Outcome: Progressing   Problem: Pain Managment: Goal: General experience of comfort will improve Outcome: Progressing   Problem: Safety: Goal: Ability to remain free from injury will improve Outcome: Progressing   Problem: Skin Integrity: Goal: Risk for impaired skin integrity will decrease Outcome: Progressing

## 2017-06-28 NOTE — Clinical Social Work Note (Signed)
CSW [facility] that [facility] are not able to accept patient backCSW spoke to patient's daughter who was at bedside to update her about placement options.  CSW informed her that insurance company was requesting a peer to peer with the Wellsite geologistmedical director.  CSW also discussed options if patient is denied for insurance, CSW explained to her the only other option is to pay privately for SNF.  Patient's daughter expressed understanding, patient's daughter also said she spoke with palliative and the family are thinking about just keeping her comfortable due to the significant decline patient has had and possibly considering hospice.  Palliative is supposed to follow up with patient tomorrow.  CSW and patient's daughter talked about how patient's Alzheimer's seems to have gotten worse over the past few weeks.  Patient's daughter stated she just wants what's best for patient.  CSW to continue to follow patient's progress throughout discharge planning.  Ervin KnackEric R. Cristianna Cyr, MSW, Theresia MajorsLCSWA [phone]  06/28/2017 12:22 PM

## 2017-06-28 NOTE — Care Management Important Message (Signed)
Important Message  Patient Details  Name: Roslynn AmbleLala C Haile MRN: [id number] Date of Birth: 07/25/1927   Medicare Important Message Given:  Yes    Olegario MessierKathy A Dayson Aboud 06/28/2017, 11:12 AM

## 2017-06-28 NOTE — Progress Notes (Signed)
New hospice home referral received from CSW Windell MouldingEric Anterhaus following a Palliative Medicine consult. Writer has spoken with patient's daughter's [name] and Elon JesterMichele, they are aware there are currently no beds available for transfer today. Hospital care team also aware. Patient information faxed to referral. Will continue to follow and update family and hospital care team. Dayna BarkerKaren Robertson RN, BSN, [facility] [place] [place], hospital Liaison [phone]

## 2017-06-28 NOTE — Progress Notes (Signed)
Palliative:  I met today with Ms. Vilchis's two daughters, Robin and Michelle, and Ms. Woodburn's sister. I did relay the message from Dr. Shah that he has reviewed chart/notes and does recommend hospice care at this time. Family understands and is accepting of poor prognosis given advancing dementia and current status. We discussed full comfort care and transition to hospice for EOL care. We discussed symptom management of agitation and scheduled Ativan to ensure comfort. They realize that she will not be eating/drinking at this time. They greatly value comfort and dignity for her at this time.   We discussed hospice options and what this means. Discussed hospice facility vs hospice at home. They are leaning towards hospice facility but will discuss more as a family. All questions/concerns addressed. Emotional support provided.   Ms. Gottschall is sedated and comfortably resting throughout our conversation.   40 min   , NP Palliative Medicine Team Pager # [phone] (M-F 8a-5p) Team Phone # [phone] (Nights/Weekends) 

## 2017-06-28 NOTE — Progress Notes (Signed)
PT Cancellation Note  Patient Details Name: Beth Leon MRN: [id number] DOB: 04/02/1928   Cancelled Treatment:    Reason Eval/Treat Not Completed: Fatigue/lethargy limiting ability to participate   Attemtped session.  Pt remains too lethargic.  Unable to awaken with verbal and tactile stimulation.  Will continue as appropriate.   Danielle DessSarah Terrence Pizana 06/28/2017, 10:26 AM

## 2017-06-28 NOTE — Progress Notes (Signed)
[employer] [facility] [place] [facility]   PATIENT NAME: Beth HoughLala Leon    MR#:  [id number]  DATE OF BIRTH:  01/09/1928  SUBJECTIVE:  CHIEF COMPLAINT:   Chief Complaint  Patient presents with  . Altered Mental Status   - patient with advanced dementia, admitted for delirium. Received Ativan for agitation and is currently sedated.  REVIEW OF SYSTEMS:  Review of Systems  Unable to perform ROS: Patient unresponsive    DRUG ALLERGIES:   Allergies  Allergen Reactions  . Atorvastatin     Other reaction(s): Unknown  . Codeine Nausea And Vomiting  . Simvastatin     Other reaction(s): Unknown    VITALS:  Blood pressure (!) 153/133, pulse 77, temperature 99.1 F (37.3 C), temperature source Oral, resp. rate 18, height 5\' 4"  (1.626 m), weight 65.8 kg (145 lb), SpO2 97 %.  PHYSICAL EXAMINATION:  Physical Exam  GENERAL:  84 y.o.-year-old patient lying in the bed, resting peacefully. Hands in mittens. EYES: Pupils equal, round, reactive to light and accommodation. No scleral icterus. Extraocular muscles intact.  HEENT: Head atraumatic, normocephalic. Oropharynx and nasopharynx clear.  NECK:  Supple, no jugular venous distention. No thyroid enlargement, no tenderness.  LUNGS: Normal breath sounds bilaterally, no wheezing, rales,rhonchi or crepitation. No use of accessory muscles of respiration. Decreased bibasilar breath sounds CARDIOVASCULAR: S1, S2 normal. No  rubs, or gallops. 2/6 systolic murmur present. ABDOMEN: Soft, nontender, nondistended. Bowel sounds present. No organomegaly or mass.  EXTREMITIES: No pedal edema, cyanosis, or clubbing.  NEUROLOGIC: sedated, able to move all extremities in bed. Withdrawing to pain  PSYCHIATRIC: The patient is sedated SKIN: No obvious rash, lesion, or ulcer.    LABORATORY PANEL:   CBC Recent Labs  Lab 06/26/17 0447  WBC 6.3  HGB 11.0*  HCT 32.9*  PLT 155    ------------------------------------------------------------------------------------------------------------------  Chemistries  Recent Labs  Lab 06/21/17 2055  06/23/17 0857  NA 135   < > 142  K 3.6   < > 3.4*  CL 102   < > 109  CO2 25   < > 26  GLUCOSE 105*   < > 100*  BUN 29*   < > 15  CREATININE 1.61*   < > 0.93  CALCIUM 8.8*   < > 9.4  MG  --   --  1.9  AST 48*  --   --   ALT 21  --   --   ALKPHOS 64  --   --   BILITOT 0.7  --   --    < > = values in this interval not displayed.   ------------------------------------------------------------------------------------------------------------------  Cardiac Enzymes Recent Labs  Lab 06/22/17 1455  TROPONINI 0.03*   ------------------------------------------------------------------------------------------------------------------  RADIOLOGY:  No results found.  EKG:   Orders placed or performed during the hospital encounter of 06/21/17  . ED EKG  . ED EKG  . EKG 12-Lead  . EKG 12-Lead    ASSESSMENT AND PLAN:   84 year old female with advanced dementia with behavioral disturbances, diabetes, CK D stage III presents from a memory care unit secondary to acute delirium  1.acute delirium on top of dementia with behavioral disturbances-discussed with outpatient neurologist, and also family members at bedside. -Patient's intake has been very poor, she has been needing psychotropic medications to keep her quiet. - family has agreed for hospice at this time. -Appreciate palliative care consult. -On Zyprexa, 3 times a day when necessary, on Remeron at bedtime and Ativan as needed.  2. Peripheral neuropathy-on Neurontin  3.hypertension-on lisinopril  4. DVT prophylaxis-subcutaneous heparin  Palliative care meeting with family today. Likely conversion to comfort care and hospice    All the records are reviewed and case discussed with Care Management/Social Workerr. Management plans discussed with the patient,  family and they are in agreement.  CODE STATUS: DNR  TOTAL TIME TAKING CARE OF THIS PATIENT: 38 minutes.   POSSIBLE D/C IN 1-2 DAYS, DEPENDING ON CLINICAL CONDITION.   Enid Baas M.D on 06/28/2017 at 12:50 PM  Between 7am to 6pm - Pager - [phone]  After 6pm go to [email or web address] - password [email or web address]  [facility]  Office  [phone]  CC: Primary care physician; Lynnea Ferrier, MD

## 2017-06-29 MED ORDER — GLYCOPYRROLATE 1 MG PO TABS: 1.0000 mg | ORAL_TABLET | Freq: Four times a day (QID) | ORAL | 0 refills | Status: AC | PRN

## 2017-06-29 MED ORDER — MORPHINE SULFATE (CONCENTRATE) 10 MG/0.5ML PO SOLN: 5.0000 mg | ORAL | 0 refills | Status: AC | PRN

## 2017-06-29 MED ORDER — LORAZEPAM 2 MG/ML PO CONC: 1.0000 mg | ORAL | 0 refills | Status: AC | PRN

## 2017-06-29 MED ORDER — LORAZEPAM 2 MG/ML IJ SOLN
0.5000 mg | INTRAMUSCULAR | Status: DC | PRN
  Administered 2017-06-29 (×2): 1 mg via INTRAVENOUS
  Filled 2017-06-29 (×3): qty 1

## 2017-06-29 NOTE — Clinical Social Work Note (Signed)
Patient still on wait list for hospice home, CSW continuing to follow patient's progress throughout discharge planning.  Ervin KnackEric R. Loa Idler, MSW, Theresia MajorsLCSWA [phone]  06/29/2017 10:27 AM

## 2017-06-29 NOTE — Progress Notes (Signed)
Daily Progress Note   Patient Name: Beth Leon       Date: 06/29/2017 DOB: [birth year]  Age: 84 y.o. MRN#: [id number] Attending Physician: Enid Baas, MD Primary Care Physician: Lynnea Ferrier, MD Admit Date: 06/21/2017  Reason for Consultation/Follow-up: Establishing goals of care, Non pain symptom management, Pain control and Psychosocial/spiritual support  Subjective: Patient is a little restless on this morning. Fidgeting to with covers. Would not open eyes when name called. Daughters were at the bedside. Verbalized that they felt she was more comfortable and relaxed with scheduled ativan. Bedside RN is preparing to administer scheduled dose. Family verbalized no concerns at this time and is aware that team is still awaiting bed availability at residential hospice facility.   Chart Reviewed and report received from bedside RN.   Length of Stay: 4  Current Medications: Scheduled Meds:  . LORazepam  1 mg Intravenous TID  . mouth rinse  15 mL Mouth Rinse BID    Continuous Infusions: . dextrose 5 % and 0.45% NaCl 50 mL/hr at 06/29/17 0649    PRN Meds: antiseptic oral rinse, glycopyrrolate, hydrALAZINE, LORazepam, morphine CONCENTRATE, [DISCONTINUED] ondansetron **OR** ondansetron (ZOFRAN) IV, polyvinyl alcohol  Physical Exam     Constitutional: Vital signs are normal. She is uncooperative. She appears ill.  Restless, continuously fidgeting with covers    Cardiovascular: Normal rate, regular rhythm and intact distal pulses.  Musculoskeletal:  Generalized weakness   Neurological: She is disoriented.  Skin: Skin is warm, dry and intact.  Some scattered bruising   Psychiatric: She is agitated. Cognition and memory are impaired.  Hx of advanced dementia    Vital Signs:  BP (!) 164/71   Pulse 91   Temp 97.8 F (36.6 C) (Oral)   Resp 20   Ht 5\' 4"  (1.626 m)   Wt 65.8 kg (145 lb)   SpO2 94%   BMI 24.89 kg/m  SpO2: SpO2: 94 % O2 Device: O2 Device: Room Air O2 Flow Rate: O2 Flow Rate (L/min): 2 L/min  Intake/output summary:   Intake/Output Summary (Last 24 hours) at 06/29/2017 1111 Last data filed at 06/29/2017 0308 Gross per 24 hour  Intake 1111.33 ml  Output -  Net 1111.33 ml   LBM: Last BM Date: 06/24/17 Baseline Weight: Weight: 68 kg (150 lb) Most recent weight: Weight:  65.8 kg (145 lb)       Palliative Assessment/Data:PPS 30%   Flowsheet Rows     Most Recent Value  Intake Tab  Referral Department  Hospitalist  Unit at Time of Referral  Med/Surg Unit  Palliative Care Primary Diagnosis  Nephrology  Date Notified  06/27/17  Palliative Care Type  New Palliative care  Reason for referral  Clarify Goals of Care  Date of Admission  06/21/17  Date first seen by Palliative Care  06/27/17  # of days Palliative referral response time  0 Day(s)  # of days IP prior to Palliative referral  6  Clinical Assessment  Psychosocial & Spiritual Assessment  Palliative Care Outcomes      Patient Active Problem List   Diagnosis Date Noted  . Late onset Alzheimer's disease with behavioral disturbance   . Acute delirium   . Goals of care, counseling/discussion   . Palliative care encounter   . Acute encephalopathy 06/21/2017  . CKD (chronic kidney disease), stage III (HCC) 06/21/2017  . Carotid stenosis 12/21/2016  . Essential hypertension 12/21/2016  . Diabetes (HCC) 12/21/2016  . Hyperlipidemia 12/21/2016    Palliative Care Assessment & Plan   Patient Profile: 84 y.o. female admitted on 06/21/2017 from [facility] with lethargy and acute encephalopathy. She has a past medical history significant for diabetes, HLD, hypertension, stage 3 kidney disease, and Mixed Alzheimer's and vascular dementia. Patient recently had some of her  medication dosages increased, specifically including lamotrigine by her neurologist (Dr. Sherryll BurgerShah). In ED patient was unable to provide history, and information was taken collaterally from nursing facility where she resides. Reportedly the patient had an episode at dinnertime where she slumped over and became very somnolent. It is unclear if she had any sort of syncopal event. Here in the ED she is able to wake up enough to answer some simple questions, but remains very somnolent and lethargic. Initial workup in ED is largely within normal limits. During the course of her admission she has become more agitated and unable to take in very little by mouth due to confusion. Palliative Medicine consulted for goals of care discussion.    Recommendations/Plan:  DNR/DNI  FULL COMFORT CARE  CSW to continue to work with hospice and notify family when bed is available.    Will increase to Ativan 0.5-1mg  IV every 2 hours PRN for agitation/anxiety. Continue with scheduled ativan.  Robinul PRN for secretions  Roxanol Sublingual PRN for severe pain and shortness of breath  Palliative Medicine will continue to support family and medical team during hospitalization  Goals of Care and Additional Recommendations:  Limitations on Scope of Treatment: Full Comfort Care per family's request   Code Status:    Code Status Orders  (From admission, onward)        Start     Ordered   06/28/17 1515  Do not attempt resuscitation (DNR)  Continuous    Question Answer Comment  In the event of cardiac or respiratory ARREST Do not call a "code blue"   In the event of cardiac or respiratory ARREST Do not perform Intubation, CPR, defibrillation or ACLS   In the event of cardiac or respiratory ARREST Use medication by any route, position, wound care, and other measures to relive pain and suffering. May use oxygen, suction and manual treatment of airway obstruction as needed for comfort.      06/28/17 1516         Most Recent Value  Type  of Special educational needs teacher of Attorney  Pre-existing out of facility DNR order (yellow form or pink MOST form)  -  "MOST" Form in Place?  -     Prognosis:   < 2 weeks in the setting of advanced dementia, chronic kidney disease, and poor po intake.   Discharge Planning:  Hospice facility  Care plan was discussed with family and bedside RN.   Thank you for allowing the Palliative Medicine Team to assist in the care of this patient.   Total Time 20 min. Prolonged Time Billed  NO       Greater than 50%  of this time was spent counseling and coordinating care related to the above assessment and plan.  Willette Alma, NP-BC Palliative Medicine Team  Phone: [phone] Fax: [phone]  Yong Channel, NP Palliative Medicine Team Pager # [phone] (M-F 8a-5p) Team Phone # [phone] (Nights/Weekends)  Please contact Palliative Medicine Team phone at [phone] for questions and concerns.

## 2017-06-29 NOTE — Clinical Social Work Note (Signed)
CSW received phone call from patient's insurance company they have denied patient for short term rehab.  Patient has had a decline, palliative met with patient again to determine discharge plan, palliative is now recommending hospice facility placement.  CSW provided hospice options for patient and her family and they chose [facility].  CSW contacted Santiago Glad the hospice nurse liaison, she stated they do not have any beds available for today, patient will be added to the wait list and hospice nurse liaison will contact CSW once bed becomes available.  CSW updated patient's family and they were appreciative of information being provided, CSW to continue to follow patient's progress throughout discharge plan.  Jones Broom. [place], MSW, [place]  06/29/2017 8:51 AM

## 2017-06-29 NOTE — Discharge Summary (Signed)
[facility] [place] [facility]   PATIENT NAME: Beth Leon    MR#:  [id number]  DATE OF BIRTH:  February 18, 1928  DATE OF ADMISSION:  06/21/2017   ADMITTING PHYSICIAN: Oralia Manis, MD  DATE OF DISCHARGE:  06/29/17  PRIMARY CARE PHYSICIAN: Lynnea Ferrier, MD   ADMISSION DIAGNOSIS:   Altered mental status, unspecified altered mental status type [R41.82] Acute kidney injury superimposed on chronic kidney disease (HCC) [N17.9, N18.9]  DISCHARGE DIAGNOSIS:   Principal Problem:   Acute encephalopathy Active Problems:   Essential hypertension   Diabetes (HCC)   Hyperlipidemia   CKD (chronic kidney disease), stage III (HCC)   Late onset Alzheimer's disease with behavioral disturbance   Acute delirium   Goals of care, counseling/discussion   Palliative care encounter   SECONDARY DIAGNOSIS:   Past Medical History:  Diagnosis Date  . Diabetes mellitus without complication (HCC)   . High cholesterol   . Hypertension     HOSPITAL COURSE:   84 year old female with advanced dementia with behavioral disturbances, diabetes, CK D stage III presents from a memory care unit secondary to acute delirium  1. Acute delirium on top of dementia with behavioral disturbances-discussed with outpatient neurologist, and also family members yesterday at bedside. -Patient's intake has been very poor, she has been needing psychotropic medications to keep her quiet. - family has agreed for hospice at this time. -Appreciate palliative care consult. -currently only on comfort meds  2. Peripheral neuropathy-on comfort meds only  3.hypertension- comfort meds only   Family agreeable for hospice home, awaiting bed at hospice home    DISCHARGE CONDITIONS:   Critical  CONSULTS OBTAINED:   Treatment Team:  Cindee Lame, MD  DRUG ALLERGIES:   Allergies  Allergen Reactions  . Atorvastatin     Other reaction(s): Unknown  . Codeine Nausea And Vomiting  .  Simvastatin     Other reaction(s): Unknown   DISCHARGE MEDICATIONS:   Allergies as of 06/29/2017      Reactions   Atorvastatin    Other reaction(s): Unknown   Codeine Nausea And Vomiting   Simvastatin    Other reaction(s): Unknown      Medication List    STOP taking these medications   acetaminophen 500 MG tablet Commonly known as:  TYLENOL   aspirin EC 81 MG tablet   busPIRone 30 MG tablet Commonly known as:  BUSPAR   clonazePAM 0.5 MG tablet Commonly known as:  KLONOPIN   cyanocobalamin 1000 MCG tablet   cyclobenzaprine 10 MG tablet Commonly known as:  FLEXERIL   gabapentin 300 MG capsule Commonly known as:  NEURONTIN   lamoTRIgine 25 MG tablet Commonly known as:  LAMICTAL   lisinopril 40 MG tablet Commonly known as:  PRINIVIL,ZESTRIL   loperamide 2 MG capsule Commonly known as:  IMODIUM   Melatonin 5 MG Tabs   mirtazapine 30 MG tablet Commonly known as:  REMERON   omeprazole 20 MG capsule Commonly known as:  PRILOSEC   pravastatin 80 MG tablet Commonly known as:  PRAVACHOL   QUEtiapine 25 MG tablet Commonly known as:  SEROQUEL   QUEtiapine 50 MG tablet Commonly known as:  SEROQUEL   traMADol 50 MG tablet Commonly known as:  ULTRAM     TAKE these medications   glycopyrrolate 1 MG tablet Commonly known as:  ROBINUL Take 1 tablet (1 mg total) by mouth every 6 (six) hours as needed (for increased secretions).   LORazepam 2 MG/ML concentrated  solution Commonly known as:  ATIVAN Take 0.5 mLs (1 mg total) by mouth every 4 (four) hours as needed for anxiety.   morphine CONCENTRATE 10 MG/0.5ML Soln concentrated solution Place 0.25 mLs (5 mg total) under the tongue every 2 (two) hours as needed for severe pain or shortness of breath.        DISCHARGE INSTRUCTIONS:   Will be discharged to Hospice Home  DISCHARGE LOCATION:   Hospice Home  If you experience worsening of your admission symptoms, develop shortness of breath, life  threatening emergency, suicidal or homicidal thoughts you must seek medical attention immediately by calling 911 or calling your MD immediately  if symptoms less severe.  You Must read complete instructions/literature along with all the possible adverse reactions/side effects for all the Medicines you take and that have been prescribed to you. Take any new Medicines after you have completely understood and accpet all the possible adverse reactions/side effects.   Please note  You were cared for by a hospitalist during your hospital stay. If you have any questions about your discharge medications or the care you received while you were in the hospital after you are discharged, you can call the unit and asked to speak with the hospitalist on call if the hospitalist that took care of you is not available. Once you are discharged, your primary care physician will handle any further medical issues. Please note that NO REFILLS for any discharge medications will be authorized once you are discharged, as it is imperative that you return to your primary care physician (or establish a relationship with a primary care physician if you do not have one) for your aftercare needs so that they can reassess your need for medications and monitor your lab values.    On the day of Discharge:  VITAL SIGNS:   Blood pressure (!) 164/71, pulse 91, temperature 97.8 F (36.6 C), temperature source Oral, resp. rate 20, height 5\' 4"  (1.626 m), weight 65.8 kg (145 lb), SpO2 94 %.  PHYSICAL EXAMINATION:    GENERAL:  84 y.o.-year-old patient lying in the bed, resting peacefully. Hands in mittens. EYES: Pupils equal, round, reactive to light and accommodation. No scleral icterus. Extraocular muscles intact.  HEENT: Head atraumatic, normocephalic. Oropharynx and nasopharynx clear.  NECK:  Supple, no jugular venous distention. No thyroid enlargement, no tenderness.  LUNGS: Normal breath sounds bilaterally, no wheezing,  rales,rhonchi or crepitation. No use of accessory muscles of respiration. Decreased bibasilar breath sounds CARDIOVASCULAR: S1, S2 normal. No  rubs, or gallops. 2/6 systolic murmur present. ABDOMEN: Soft, nontender, nondistended. Bowel sounds present. No organomegaly or mass.  EXTREMITIES: No pedal edema, cyanosis, or clubbing.  NEUROLOGIC: sedated, able to move all extremities in bed.opening eyes to name. Withdrawing to pain  PSYCHIATRIC: The patient is sedated SKIN: No obvious rash, lesion, or ulcer.    DATA REVIEW:   CBC Recent Labs  Lab 06/26/17 0447  WBC 6.3  HGB 11.0*  HCT 32.9*  PLT 155    Chemistries  Recent Labs  Lab 06/23/17 0857  NA 142  K 3.4*  CL 109  CO2 26  GLUCOSE 100*  BUN 15  CREATININE 0.93  CALCIUM 9.4  MG 1.9     Microbiology Results  Results for orders placed or performed during the hospital encounter of 06/21/17  MRSA PCR Screening     Status: None   Collection Time: 06/22/17  2:55 AM  Result Value Ref Range Status   MRSA by PCR NEGATIVE NEGATIVE Final  Comment:        The GeneXpert MRSA Assay (FDA approved for NASAL specimens only), is one component of a comprehensive MRSA colonization surveillance program. It is not intended to diagnose MRSA infection nor to guide or monitor treatment for MRSA infections. Performed at [facility], [address], [place], [place] [address]     RADIOLOGY:  No results found.   Management plans discussed with the patient, family and they are in agreement.  CODE STATUS:     Code Status Orders  (From admission, onward)        Start     Ordered   06/28/17 1515  Do not attempt resuscitation (DNR)  Continuous    Question Answer Comment  In the event of cardiac or respiratory ARREST Do not call a "code blue"   In the event of cardiac or respiratory ARREST Do not perform Intubation, CPR, defibrillation or ACLS   In the event of cardiac or respiratory ARREST Use medication by any  route, position, wound care, and other measures to relive pain and suffering. May use oxygen, suction and manual treatment of airway obstruction as needed for comfort.      06/28/17 1516    Code Status History    Date Active Date Inactive Code Status Order ID Comments User Context   06/27/2017 1555 06/28/2017 1516 DNR [id number]  Ulice Bold, NP Inpatient   06/22/2017 0221 06/27/2017 1555 Full Code [id number]  Oralia Manis, MD Inpatient    Advance Directive Documentation     Most Recent Value  Type of Advance Directive  Healthcare Power of Attorney  Pre-existing out of facility DNR order (yellow form or pink MOST form)  -  "MOST" Form in Place?  -      TOTAL TIME TAKING CARE OF THIS PATIENT: 38 minutes.    Enid Baas M.D on 06/29/2017 at 11:40 AM  Between 7am to 6pm - Pager - [phone]  After 6pm go to [email or web address] - password [email or web address] [facility]  [facility]  Office  [phone]  CC: Primary care physician; Lynnea Ferrier, MD   Note: This dictation was prepared with Dragon dictation along with smaller phrase technology. Any transcriptional errors that result from this process are unintentional.

## 2017-06-29 NOTE — Progress Notes (Signed)
[facility] [place] at [facility]   PATIENT NAME: Beth HoughLala Leon    MR#:  [id number]  DATE OF BIRTH:  02/19/1928  SUBJECTIVE:  CHIEF COMPLAINT:   Chief Complaint  Patient presents with  . Altered Mental Status   - patient with advanced dementia, admitted for delirium.  - patient receiving Ativan. Resting comfortably.  REVIEW OF SYSTEMS:  Review of Systems  Unable to perform ROS: Patient unresponsive    DRUG ALLERGIES:   Allergies  Allergen Reactions  . Atorvastatin     Other reaction(s): Unknown  . Codeine Nausea And Vomiting  . Simvastatin     Other reaction(s): Unknown    VITALS:  Blood pressure (!) 164/71, pulse 91, temperature 97.8 F (36.6 C), temperature source Oral, resp. rate 20, height 5\' 4"  (1.626 m), weight 65.8 kg (145 lb), SpO2 94 %.  PHYSICAL EXAMINATION:  Physical Exam  GENERAL:  84 y.o.-year-old patient lying in the bed, resting peacefully. Hands in mittens. EYES: Pupils equal, round, reactive to light and accommodation. No scleral icterus. Extraocular muscles intact.  HEENT: Head atraumatic, normocephalic. Oropharynx and nasopharynx clear.  NECK:  Supple, no jugular venous distention. No thyroid enlargement, no tenderness.  LUNGS: Normal breath sounds bilaterally, no wheezing, rales,rhonchi or crepitation. No use of accessory muscles of respiration. Decreased bibasilar breath sounds CARDIOVASCULAR: S1, S2 normal. No  rubs, or gallops. 2/6 systolic murmur present. ABDOMEN: Soft, nontender, nondistended. Bowel sounds present. No organomegaly or mass.  EXTREMITIES: No pedal edema, cyanosis, or clubbing.  NEUROLOGIC: sedated, able to move all extremities in bed.opening eyes to name. Withdrawing to pain  PSYCHIATRIC: The patient is sedated SKIN: No obvious rash, lesion, or ulcer.    LABORATORY PANEL:   CBC Recent Labs  Lab 06/26/17 0447  WBC 6.3  HGB 11.0*  HCT 32.9*  PLT 155    ------------------------------------------------------------------------------------------------------------------  Chemistries  Recent Labs  Lab 06/23/17 0857  NA 142  K 3.4*  CL 109  CO2 26  GLUCOSE 100*  BUN 15  CREATININE 0.93  CALCIUM 9.4  MG 1.9   ------------------------------------------------------------------------------------------------------------------  Cardiac Enzymes Recent Labs  Lab 06/22/17 1455  TROPONINI 0.03*   ------------------------------------------------------------------------------------------------------------------  RADIOLOGY:  No results found.  EKG:   Orders placed or performed during the hospital encounter of 06/21/17  . ED EKG  . ED EKG    ASSESSMENT AND PLAN:   84 year old female with advanced dementia with behavioral disturbances, diabetes, CK D stage III presents from a memory care unit secondary to acute delirium  1.acute delirium on top of dementia with behavioral disturbances-discussed with outpatient neurologist, and also family members yesterday at bedside. -Patient's intake has been very poor, she has been needing psychotropic medications to keep her quiet. - family has agreed for hospice at this time. -Appreciate palliative care consult. -currently only on comfort meds  2. Peripheral neuropathy-on comfort meds only  3.hypertension- comfort meds only   Family agreeable for hospice home, awaiting bed at hospice home    All the records are reviewed and case discussed with Care Management/Social Workerr. Management plans discussed with the patient, family and they are in agreement.  CODE STATUS: DNR  TOTAL TIME TAKING CARE OF THIS PATIENT: 23 minutes.   POSSIBLE D/C TODAY, DEPENDING ON CLINICAL CONDITION.   Enid BaasKALISETTI,Aubrey Voong M.D on 06/29/2017 at 8:26 AM  Between 7am to 6pm - Pager - [phone]  After 6pm go to [email or web address] - Social research officer, governmentpassword [email or web address]  [facility] [place] [facility]  Office   [phone]  CC:  Primary care physician; Lynnea Ferrier, MD

## 2017-06-29 NOTE — Clinical Social Work Note (Signed)
CSW updated patient's family with the name of the CSW that will be following patient over the weekend.  CSW informed them that weekend CSW will coordinate with [facility].  Family very appreciative of assistance with patient's discharge planning process.  Ervin KnackEric R. Skipper Dacosta, MSW, Theresia MajorsLCSWA [phone]  06/29/2017 5:59 PM

## 2017-06-29 NOTE — Plan of Care (Signed)
  Problem: Education: Goal: Knowledge of General Education information will improve Outcome: Progressing Note:  Per family pt unable   Problem: Clinical Measurements: Goal: Ability to maintain clinical measurements within normal limits will improve Outcome: Progressing Goal: Will remain free from infection Outcome: Progressing Goal: Diagnostic test results will improve Outcome: Progressing Goal: Respiratory complications will improve Outcome: Progressing Goal: Cardiovascular complication will be avoided Outcome: Progressing   Problem: Coping: Goal: Level of anxiety will decrease Outcome: Progressing   Problem: Pain Managment: Goal: General experience of comfort will improve Outcome: Progressing   Problem: Safety: Goal: Ability to remain free from injury will improve Outcome: Progressing   Problem: Skin Integrity: Goal: Risk for impaired skin integrity will decrease Outcome: Progressing

## 2017-06-29 NOTE — Progress Notes (Signed)
Follow up visit made to new hospice home referral.Pateint seen lying in bed, very agitate, pulling at IV tubing, gown and bed covers. Daughter Elon JesterMichele at bedside. Staff RN Baltazar NajjarWanette notified and PRN medication given. Patient remains restless/agitated. Patient noted to be wet, changed, bathed  and repositioned with staff Wilnette Kalesadie Dorothy. Patient appeared to have settled at end of visit. Family and hospital care team aware there is currently no bed available at the hospice home to transfer. Will continue to follow and provide emotional support. Dayna BarkerKaren Robertson RN, BSN, [facility], hospital Liaison [phone]

## 2017-06-30 NOTE — Clinical Social Work Note (Signed)
The CSW spoke with RN Debbie from the [facility] to discuss wait list and bed availability for hospice care. Debbie indicated that no beds are available at this time and the patient is #3 on the wait list. Eunice BlaseDebbie will update the CSW once a bed is available. CSW is following.  Argentina PonderKaren Martha Subhan Hoopes, MSW, Theresia MajorsLCSWA [phone]

## 2017-06-30 NOTE — Progress Notes (Signed)
[facility] - [place] at [facility]   PATIENT NAME: Beth Leon    MR#:  [id number]  DATE OF BIRTH:  06/21/1927  SUBJECTIVE:  CHIEF COMPLAINT:   Chief Complaint  Patient presents with  . Altered Mental Status  Family at the bedside, no complaints, patient is encephalopathic/comfortable REVIEW OF SYSTEMS:  CONSTITUTIONAL: No fever, fatigue or weakness.  EYES: No blurred or double vision.  EARS, NOSE, AND THROAT: No tinnitus or ear pain.  RESPIRATORY: No cough, shortness of breath, wheezing or hemoptysis.  CARDIOVASCULAR: No chest pain, orthopnea, edema.  GASTROINTESTINAL: No nausea, vomiting, diarrhea or abdominal pain.  GENITOURINARY: No dysuria, hematuria.  ENDOCRINE: No polyuria, nocturia,  HEMATOLOGY: No anemia, easy bruising or bleeding SKIN: No rash or lesion. MUSCULOSKELETAL: No joint pain or arthritis.   NEUROLOGIC: No tingling, numbness, weakness.  PSYCHIATRY: No anxiety or depression.   ROS  DRUG ALLERGIES:   Allergies  Allergen Reactions  . Atorvastatin     Other reaction(s): Unknown  . Codeine Nausea And Vomiting  . Simvastatin     Other reaction(s): Unknown    VITALS:  Blood pressure (!) 199/92, pulse 63, temperature 99.7 F (37.6 C), temperature source Axillary, resp. rate 16, height 5\' 4"  (1.626 m), weight 68.4 kg (150 lb 11.2 oz), SpO2 92 %.  PHYSICAL EXAMINATION:  GENERAL:  84 y.o.-year-old patient lying in the bed with no acute distress.  EYES: Pupils equal, round, reactive to light and accommodation. No scleral icterus. Extraocular muscles intact.  HEENT: Head atraumatic, normocephalic. Oropharynx and nasopharynx clear.  NECK:  Supple, no jugular venous distention. No thyroid enlargement, no tenderness.  LUNGS: Normal breath sounds bilaterally, no wheezing, rales,rhonchi or crepitation. No use of accessory muscles of respiration.  CARDIOVASCULAR: S1, S2 normal. No murmurs, rubs, or gallops.  ABDOMEN: Soft, nontender, nondistended.  Bowel sounds present. No organomegaly or mass.  EXTREMITIES: No pedal edema, cyanosis, or clubbing.  NEUROLOGIC: Cranial nerves II through XII are intact. Muscle strength 5/5 in all extremities. Sensation intact. Gait not checked.  PSYCHIATRIC: The patient is alert and oriented x 3.  SKIN: No obvious rash, lesion, or ulcer.   Physical Exam LABORATORY PANEL:   CBC Recent Labs  Lab 06/26/17 0447  WBC 6.3  HGB 11.0*  HCT 32.9*  PLT 155   ------------------------------------------------------------------------------------------------------------------  Chemistries  No results for input(s): NA, K, CL, CO2, GLUCOSE, BUN, CREATININE, CALCIUM, MG, AST, ALT, ALKPHOS, BILITOT in the last 168 hours.  Invalid input(s): GFRCGP ------------------------------------------------------------------------------------------------------------------  Cardiac Enzymes No results for input(s): TROPONINI in the last 168 hours. ------------------------------------------------------------------------------------------------------------------  RADIOLOGY:  No results found.  ASSESSMENT AND PLAN:  84 year old female with advanced dementia with behavioral disturbances, diabetes, CK D stage III presents from a memory care unit secondary to acute delirium  1. Acute delirium on top of dementia with behavioral disturbances-discussed with outpatient neurologist, and also family membersyesterdayat bedside. Awaiting hospice house bed  2. Peripheral neuropathy comfort care only   3.hypertension Comfort care status  Awaiting hospice house bed  All the records are reviewed and case discussed with Care Management/Social Workerr. Management plans discussed with the patient, family and they are in agreement.  CODE STATUS: dnr  TOTAL TIME TAKING CARE OF THIS PATIENT: 35 minutes.     POSSIBLE D/C IN 1-2DAYS, DEPENDING ON CLINICAL CONDITION.   [name] [name] M.D on 06/30/2017   Between 7am to 6pm -  Pager - [phone]  After 6pm go to [email or web address] - password [email or web address]  [employer] [facility]  Office  [phone]  CC: Primary care physician; Lynnea Ferrier, MD  Note: This dictation was prepared with Dragon dictation along with smaller phrase technology. Any transcriptional errors that result from this process are unintentional.

## 2017-07-01 NOTE — Progress Notes (Signed)
Chaplain met with sisters Ailene Ards, and Sharyn Lull to offer pastoral care for their mother. Chaplain provided ministry of presence, active listening, and prayed with the family.

## 2017-07-01 NOTE — Clinical Social Work Note (Signed)
The patient will discharge today to the [facility] via non-emergent EMS. The patient's family is aware and will meet her at the facility after 2:30 pm. The facility has requested that transport take place at or around 2:30 pm to give the facility time to prepare the room for intake. The CSW will deliver the discharge packet and then sign off. Please consult should additional needs arise.  Beth PonderKaren Martha Crystall Leon, MSW, Theresia MajorsLCSWA [phone]

## 2017-07-01 NOTE — Discharge Summary (Signed)
[facility] -  at [facility]   PATIENT NAME: Beth Leon    MR#:  [id number]  DATE OF BIRTH:  [birth year]  DATE OF ADMISSION:  06/21/2017 ADMITTING PHYSICIAN: Oralia Manis, MD  DATE OF DISCHARGE: No discharge date for patient encounter.  PRIMARY CARE PHYSICIAN: Lynnea Ferrier, MD    ADMISSION DIAGNOSIS:  Altered mental status, unspecified altered mental status type [R41.82] Acute kidney injury superimposed on chronic kidney disease (HCC) [N17.9, N18.9]  DISCHARGE DIAGNOSIS:  Principal Problem:   Acute encephalopathy Active Problems:   Essential hypertension   Diabetes (HCC)   Hyperlipidemia   CKD (chronic kidney disease), stage III (HCC)   Late onset Alzheimer's disease with behavioral disturbance   Acute delirium   Goals of care, counseling/discussion   Palliative care encounter   SECONDARY DIAGNOSIS:   Past Medical History:  Diagnosis Date  . Diabetes mellitus without complication (HCC)   . High cholesterol   . Hypertension     HOSPITAL COURSE:  84 year old female with advanced dementia with behavioral disturbances, diabetes, CK D stage III presents from a memory care unit secondary to acute delirium  1. Acute delirium on top of dementia with behavioral disturbances-discussed with outpatient neurologist, and also family membersyesterdayat bedside. -Patient's intake has been very poor, she has been needing psychotropic medications to keep her quiet. - family has agreed for hospice at this time. -Appreciate palliative care consult. -currently only on comfort meds  2. Peripheral neuropathy-oncomfort meds only  3.hypertension-comfort meds only   Family agreeable for hospice home, awaiting bed at hospice home     DISCHARGE CONDITIONS:  Terminal state   CONSULTS OBTAINED:  Treatment Team:  Cindee Lame, MD  DRUG ALLERGIES:   Allergies  Allergen Reactions  . Atorvastatin     Other reaction(s): Unknown  .  Codeine Nausea And Vomiting  . Simvastatin     Other reaction(s): Unknown    DISCHARGE MEDICATIONS:   Allergies as of 07/01/2017      Reactions   Atorvastatin    Other reaction(s): Unknown   Codeine Nausea And Vomiting   Simvastatin    Other reaction(s): Unknown      Medication List    STOP taking these medications   acetaminophen 500 MG tablet Commonly known as:  TYLENOL   aspirin EC 81 MG tablet   busPIRone 30 MG tablet Commonly known as:  BUSPAR   clonazePAM 0.5 MG tablet Commonly known as:  KLONOPIN   cyanocobalamin 1000 MCG tablet   cyclobenzaprine 10 MG tablet Commonly known as:  FLEXERIL   gabapentin 300 MG capsule Commonly known as:  NEURONTIN   lamoTRIgine 25 MG tablet Commonly known as:  LAMICTAL   lisinopril 40 MG tablet Commonly known as:  PRINIVIL,ZESTRIL   loperamide 2 MG capsule Commonly known as:  IMODIUM   Melatonin 5 MG Tabs   mirtazapine 30 MG tablet Commonly known as:  REMERON   omeprazole 20 MG capsule Commonly known as:  PRILOSEC   pravastatin 80 MG tablet Commonly known as:  PRAVACHOL   QUEtiapine 25 MG tablet Commonly known as:  SEROQUEL   QUEtiapine 50 MG tablet Commonly known as:  SEROQUEL   traMADol 50 MG tablet Commonly known as:  ULTRAM     TAKE these medications   glycopyrrolate 1 MG tablet Commonly known as:  ROBINUL Take 1 tablet (1 mg total) by mouth every 6 (six) hours as needed (for increased secretions).   LORazepam 2 MG/ML concentrated solution Commonly  known as:  ATIVAN Take 0.5 mLs (1 mg total) by mouth every 4 (four) hours as needed for anxiety.   morphine CONCENTRATE 10 MG/0.5ML Soln concentrated solution Place 0.25 mLs (5 mg total) under the tongue every 2 (two) hours as needed for severe pain or shortness of breath.        DISCHARGE INSTRUCTIONS:   If you experience worsening of your admission symptoms, develop shortness of breath, life threatening emergency, suicidal or homicidal thoughts  you must seek medical attention immediately by calling 911 or calling your MD immediately  if symptoms less severe.  You Must read complete instructions/literature along with all the possible adverse reactions/side effects for all the Medicines you take and that have been prescribed to you. Take any new Medicines after you have completely understood and accept all the possible adverse reactions/side effects.   Please note  You were cared for by a hospitalist during your hospital stay. If you have any questions about your discharge medications or the care you received while you were in the hospital after you are discharged, you can call the unit and asked to speak with the hospitalist on call if the hospitalist that took care of you is not available. Once you are discharged, your primary care physician will handle any further medical issues. Please note that NO REFILLS for any discharge medications will be authorized once you are discharged, as it is imperative that you return to your primary care physician (or establish a relationship with a primary care physician if you do not have one) for your aftercare needs so that they can reassess your need for medications and monitor your lab values.    Today   CHIEF COMPLAINT:   Chief Complaint  Patient presents with  . Altered Mental Status    HISTORY OF PRESENT ILLNESS:  84 y.o. female who presents with lethargy and acute encephalopathy.  Patient is recently had some of her medication dosages increased, specifically including lamotrigine.  She is unable to provide history, and information is taken collaterally from nursing facility where she resides.  Reportedly the patient had an episode at dinnertime where she slumped over and became very somnolent.  It is unclear if she had any sort of syncopal event.  Here in the ED she is able to wake up enough to answer some simple questions, but remains very somnolent and lethargic.  Initial workup is largely  within normal limits.  Hospitalist were called for admission and further evaluation.  VITAL SIGNS:  Blood pressure (!) 159/76, pulse 71, temperature 99.1 F (37.3 C), resp. rate 18, height 5\' 4"  (1.626 m), weight 68.4 kg (150 lb 11.2 oz), SpO2 91 %.  I/O:  No intake or output data in the 24 hours ending 07/01/17 1137  PHYSICAL EXAMINATION:  GENERAL:  84 y.o.-year-old patient lying in the bed with no acute distress.  EYES: Pupils equal, round, reactive to light and accommodation. No scleral icterus. Extraocular muscles intact.  HEENT: Head atraumatic, normocephalic. Oropharynx and nasopharynx clear.  NECK:  Supple, no jugular venous distention. No thyroid enlargement, no tenderness.  LUNGS: Normal breath sounds bilaterally, no wheezing, rales,rhonchi or crepitation. No use of accessory muscles of respiration.  CARDIOVASCULAR: S1, S2 normal. No murmurs, rubs, or gallops.  ABDOMEN: Soft, non-tender, non-distended. Bowel sounds present. No organomegaly or mass.  EXTREMITIES: No pedal edema, cyanosis, or clubbing.  NEUROLOGIC: Cranial nerves II through XII are intact. Muscle strength 5/5 in all extremities. Sensation intact. Gait not checked.  PSYCHIATRIC: The  patient is alert and oriented x 3.  SKIN: No obvious rash, lesion, or ulcer.   DATA REVIEW:   CBC Recent Labs  Lab 06/26/17 0447  WBC 6.3  HGB 11.0*  HCT 32.9*  PLT 155    Chemistries  No results for input(s): NA, K, CL, CO2, GLUCOSE, BUN, CREATININE, CALCIUM, MG, AST, ALT, ALKPHOS, BILITOT in the last 168 hours.  Invalid input(s): GFRCGP  Cardiac Enzymes No results for input(s): TROPONINI in the last 168 hours.  Microbiology Results  Results for orders placed or performed during the hospital encounter of 06/21/17  MRSA PCR Screening     Status: None   Collection Time: 06/22/17  2:55 AM  Result Value Ref Range Status   MRSA by PCR NEGATIVE NEGATIVE Final    Comment:        The GeneXpert MRSA Assay (FDA approved for  NASAL specimens only), is one component of a comprehensive MRSA colonization surveillance program. It is not intended to diagnose MRSA infection nor to guide or monitor treatment for MRSA infections. Performed at [facility], [address] [place], [place] [address]     RADIOLOGY:  No results found.  EKG:   Orders placed or performed during the hospital encounter of 06/21/17  . ED EKG  . ED EKG      Management plans discussed with the patient, family and they are in agreement.  CODE STATUS:     Code Status Orders  (From admission, onward)        Start     Ordered   06/28/17 1515  Do not attempt resuscitation (DNR)  Continuous    Question Answer Comment  In the event of cardiac or respiratory ARREST Do not call a "code blue"   In the event of cardiac or respiratory ARREST Do not perform Intubation, CPR, defibrillation or ACLS   In the event of cardiac or respiratory ARREST Use medication by any route, position, wound care, and other measures to relive pain and suffering. May use oxygen, suction and manual treatment of airway obstruction as needed for comfort.      06/28/17 1516    Code Status History    Date Active Date Inactive Code Status Order ID Comments User Context   06/27/2017 1555 06/28/2017 1516 DNR [id number]  Ulice BoldParker, Alicia C, NP Inpatient   06/22/2017 0221 06/27/2017 1555 Full Code [id number]  Oralia ManisWillis, David, MD Inpatient    Advance Directive Documentation     Most Recent Value  Type of Advance Directive  Healthcare Power of Attorney  Pre-existing out of facility DNR order (yellow form or pink MOST form)  -  "MOST" Form in Place?  -      TOTAL TIME TAKING CARE OF THIS PATIENT: 35 minutes.    [name] D Liv Rallis M.D on 07/01/2017 at 11:37 AM  Between 7am to 6pm - Pager - [phone]  After 6pm go to [email or web address] - password [email or web address]  [facility] [place] [facility]  Office  [phone]  CC: Primary care physician; Lynnea FerrierKlein, Bert J III,  MD   Note: This dictation was prepared with Dragon dictation along with smaller phrase technology. Any transcriptional errors that result from this process are unintentional. [facility] - [place] at [facility]

## 2017-07-01 NOTE — Progress Notes (Signed)
[facility]. Report given to Renelda Momobin Ferguson, RN.  AVS and RX placed in discharge packet. Transported by EMS.

## 2017-07-01 NOTE — Progress Notes (Signed)
[facility] - [place] at [facility]   PATIENT NAME: Beth HoughLala Leon    MR#:  [id number]  DATE OF BIRTH:  06/08/1927  SUBJECTIVE:  CHIEF COMPLAINT:   Chief Complaint  Patient presents with  . Altered Mental Status  Family at the bedside, noted coughing gurgling per family, patient resting comfortably in bed, awaiting hospice house bed  REVIEW OF SYSTEMS:  CONSTITUTIONAL: No fever, fatigue or weakness.  EYES: No blurred or double vision.  EARS, NOSE, AND THROAT: No tinnitus or ear pain.  RESPIRATORY: No cough, shortness of breath, wheezing or hemoptysis.  CARDIOVASCULAR: No chest pain, orthopnea, edema.  GASTROINTESTINAL: No nausea, vomiting, diarrhea or abdominal pain.  GENITOURINARY: No dysuria, hematuria.  ENDOCRINE: No polyuria, nocturia,  HEMATOLOGY: No anemia, easy bruising or bleeding SKIN: No rash or lesion. MUSCULOSKELETAL: No joint pain or arthritis.   NEUROLOGIC: No tingling, numbness, weakness.  PSYCHIATRY: No anxiety or depression.   ROS  DRUG ALLERGIES:   Allergies  Allergen Reactions  . Atorvastatin     Other reaction(s): Unknown  . Codeine Nausea And Vomiting  . Simvastatin     Other reaction(s): Unknown    VITALS:  Blood pressure (!) 159/76, pulse 71, temperature 99.1 F (37.3 C), resp. rate 18, height 5\' 4"  (1.626 m), weight 68.4 kg (150 lb 11.2 oz), SpO2 91 %.  PHYSICAL EXAMINATION:  GENERAL:  84 y.o.-year-old patient lying in the bed with no acute distress.  EYES: Pupils equal, round, reactive to light and accommodation. No scleral icterus. Extraocular muscles intact.  HEENT: Head atraumatic, normocephalic. Oropharynx and nasopharynx clear.  NECK:  Supple, no jugular venous distention. No thyroid enlargement, no tenderness.  LUNGS: Normal breath sounds bilaterally, no wheezing, rales,rhonchi or crepitation. No use of accessory muscles of respiration.  CARDIOVASCULAR: S1, S2 normal. No murmurs, rubs, or gallops.  ABDOMEN: Soft,  nontender, nondistended. Bowel sounds present. No organomegaly or mass.  EXTREMITIES: No pedal edema, cyanosis, or clubbing.  NEUROLOGIC: Cranial nerves II through XII are intact. Muscle strength 5/5 in all extremities. Sensation intact. Gait not checked.  PSYCHIATRIC: The patient is alert and oriented x 3.  SKIN: No obvious rash, lesion, or ulcer.   Physical Exam LABORATORY PANEL:   CBC Recent Labs  Lab 06/26/17 0447  WBC 6.3  HGB 11.0*  HCT 32.9*  PLT 155   ------------------------------------------------------------------------------------------------------------------  Chemistries  No results for input(s): NA, K, CL, CO2, GLUCOSE, BUN, CREATININE, CALCIUM, MG, AST, ALT, ALKPHOS, BILITOT in the last 168 hours.  Invalid input(s): GFRCGP ------------------------------------------------------------------------------------------------------------------  Cardiac Enzymes No results for input(s): TROPONINI in the last 168 hours. ------------------------------------------------------------------------------------------------------------------  RADIOLOGY:  No results found.  ASSESSMENT AND PLAN:  84 year old female with advanced dementia with behavioral disturbances, diabetes, CK D stage III presents from a memory care unit secondary to acute delirium  1.Acute delirium on top of dementia with behavioral disturbances-discussed with outpatient neurologist, and also family membersyesterdayat bedside. Awaiting hospice house bed  2. Peripheral neuropathy comfort care only   3.hypertension Comfort care status  Awaiting hospice house bed   All the records are reviewed and case discussed with Care Management/Social Workerr. Management plans discussed with the patient, family and they are in agreement.  CODE STATUS: DNR/CC  TOTAL TIME TAKING CARE OF THIS PATIENT: 35 minutes.     POSSIBLE D/C IN 1-2 DAYS, DEPENDING ON CLINICAL CONDITION.   [name] D Arihant Pennings M.D on  07/01/2017   Between 7am to 6pm - Pager - [phone]  After 6pm go to [email or web address] -  password [email or web address]  [facility] [place] [facility]  Office  [phone]  CC: Primary care physician; Lynnea Ferrier, MD  Note: This dictation was prepared with Dragon dictation along with smaller phrase technology. Any transcriptional errors that result from this process are unintentional.

## 2017-08-01 DEATH — deceased

## 2017-12-24 ENCOUNTER — Ambulatory Visit (INDEPENDENT_AMBULATORY_CARE_PROVIDER_SITE_OTHER): Payer: Medicare Other | Admitting: Vascular Surgery

## 2017-12-24 ENCOUNTER — Encounter (INDEPENDENT_AMBULATORY_CARE_PROVIDER_SITE_OTHER): Payer: Medicare Other

## 2018-04-24 IMAGING — CT CT HEAD W/O CM
1 series · 15 of 30 positions shown, 19 images · non-contrast
Comparison: March 09, 2016

CLINICAL DATA: Dizziness with intermittent altered mental status

EXAM:
CT HEAD WITHOUT CONTRAST
TECHNIQUE: Contiguous axial images were obtained from the base of the skull
through the vertex without intravenous contrast.

[Series 3: ax head wo · axial · 0.41mm/px · z∈[-82,+58]mm · 15 of 32 slices shown, 19 images]
[im 2/32  brain]
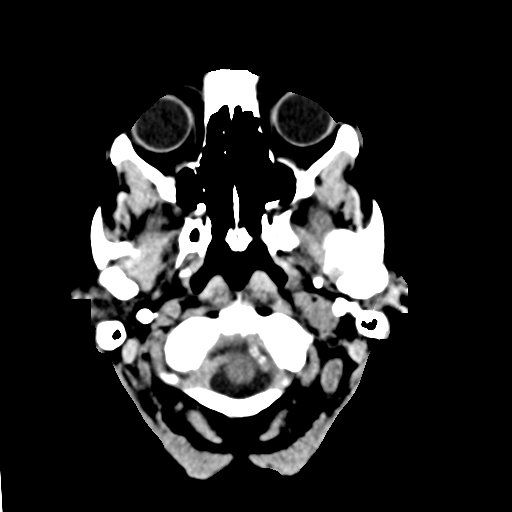
[im 2/32  bone]
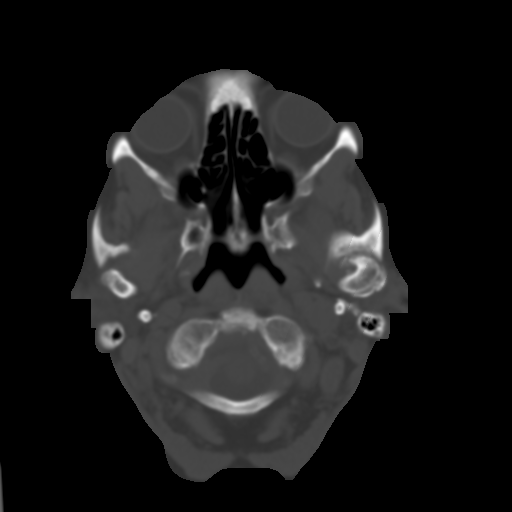
[im 4/32  brain]
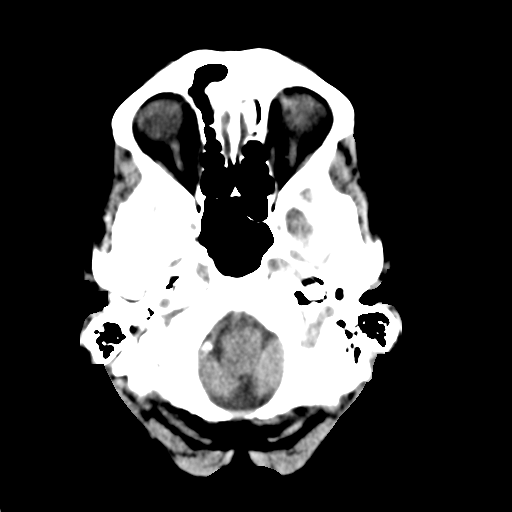
[im 6/32  brain]
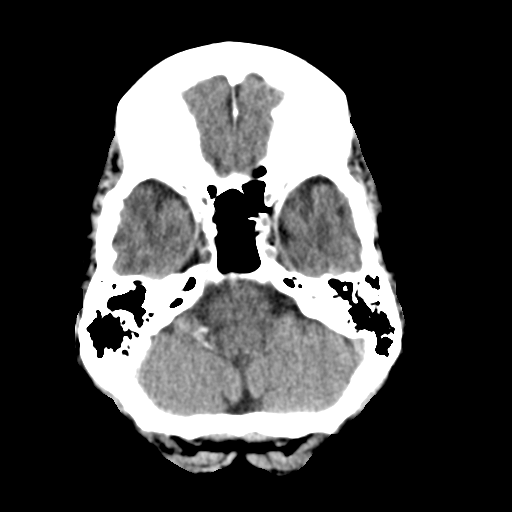
[im 8/32  brain]
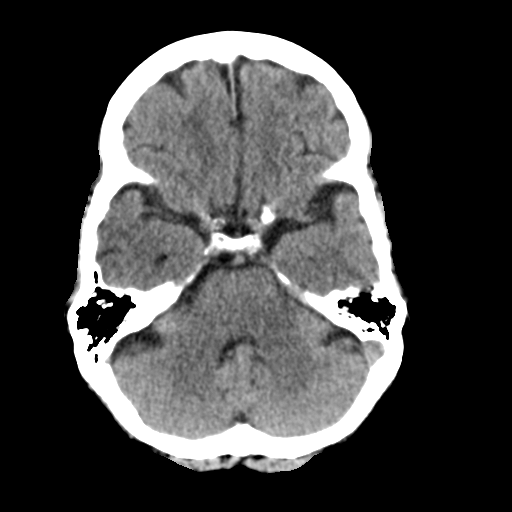
[im 10/32  brain]
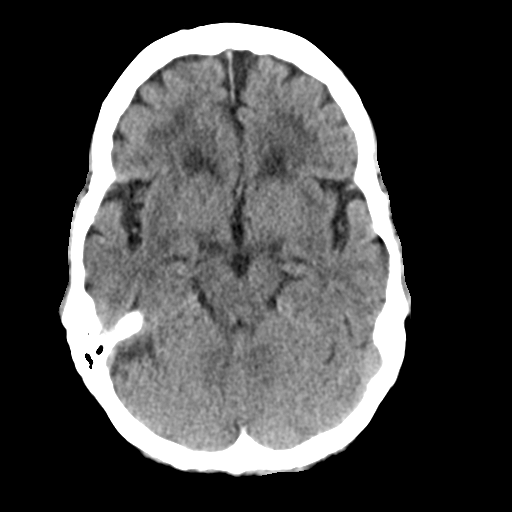
[im 10/32  bone]
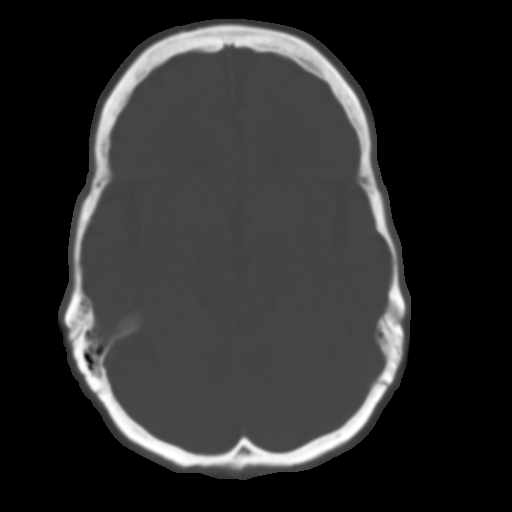
[im 12/32  brain]
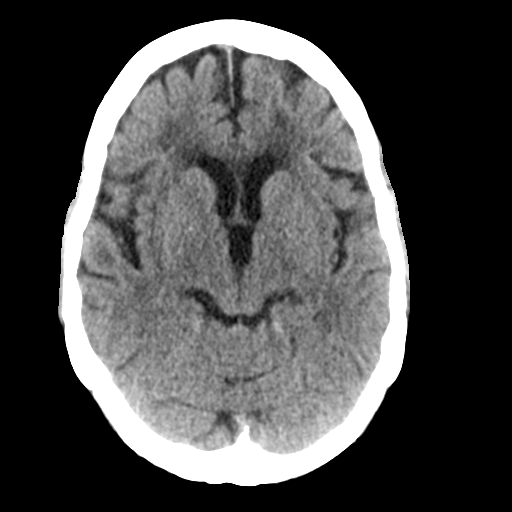
[im 14/32  brain]
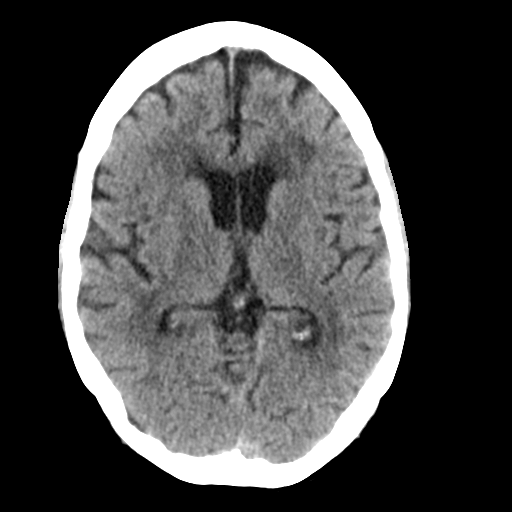
[im 17/32  brain]
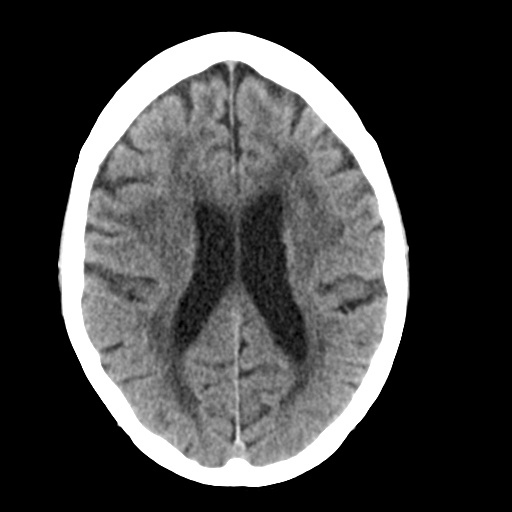
[im 18/32  brain]
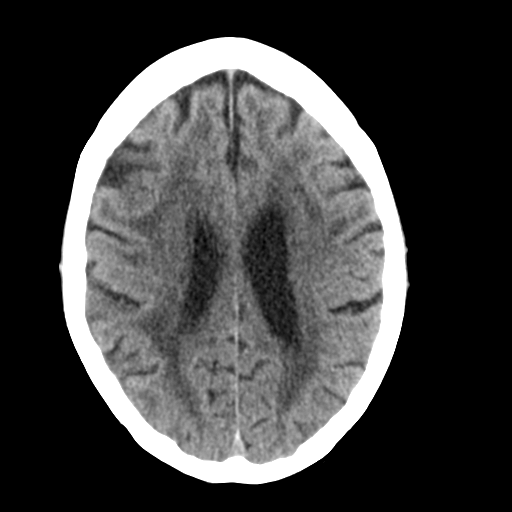
[im 18/32  bone]
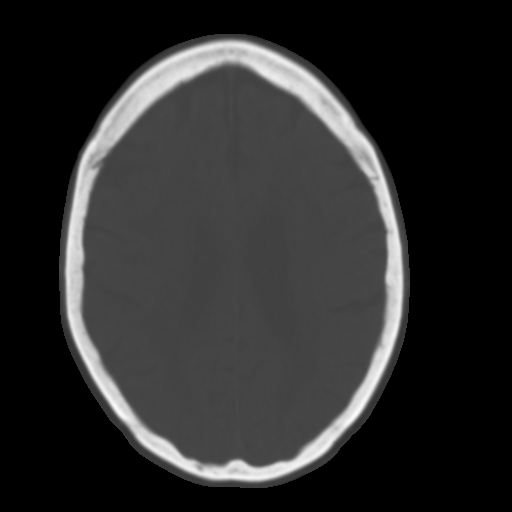
[im 20/32  brain]
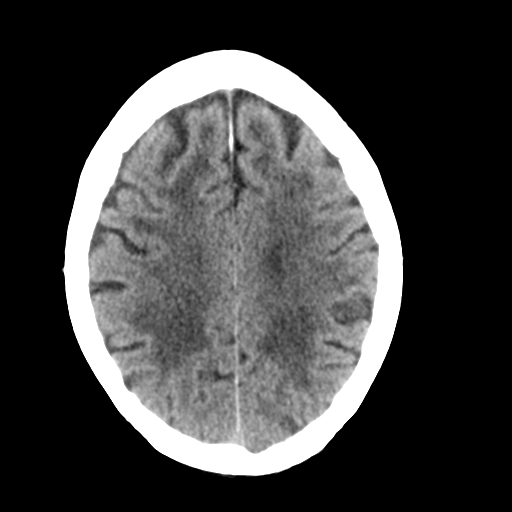
[im 22/32  brain]
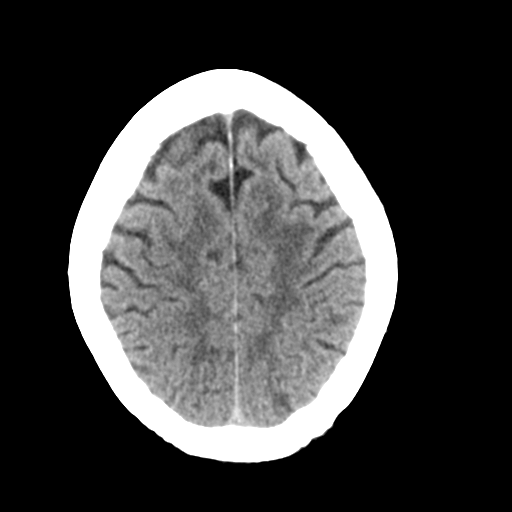
[im 24/32  brain]
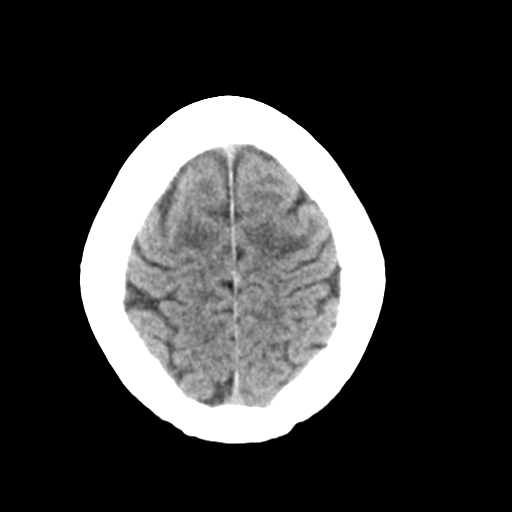
[im 26/32  brain]
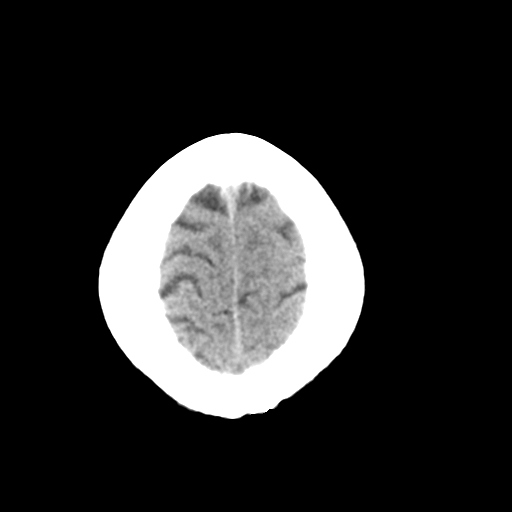
[im 26/32  bone]
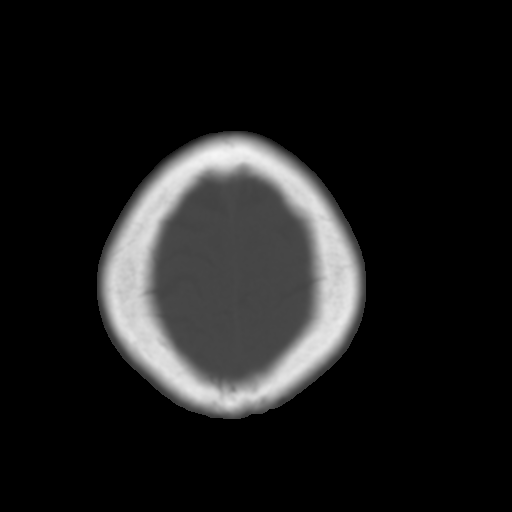
[im 28/32  brain]
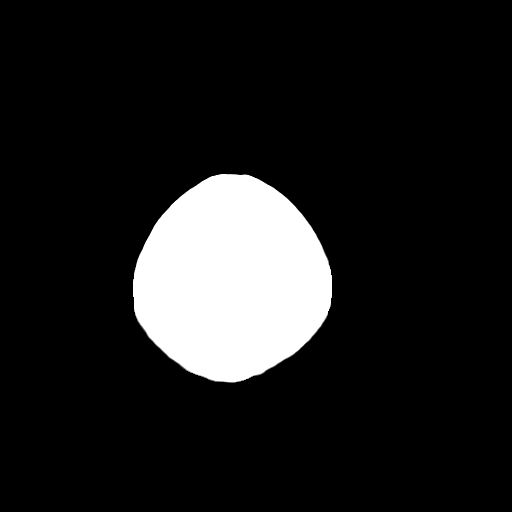
[im 30/32  brain]
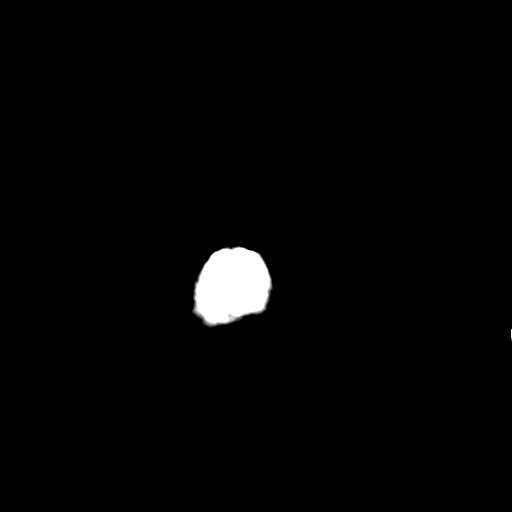

[15 of 30 positions shown; findings below may reference images not displayed]

FINDINGS: Brain: There is mild diffuse atrophy. There is no intracranial mass,
hemorrhage, extra-axial fluid collection, or midline shift. There is
patchy small vessel disease in the centra semiovale bilaterally.
There are scattered lacunar infarcts throughout the centra semiovale
bilaterally, stable. There is small vessel disease in each thalamus.
No evident acute infarct.

Vascular: There is no hyperdense vessel. There is calcification in
each carotid siphon.

Skull: Bony calvarium appears intact.

Sinuses/Orbits: Visualized paranasal sinuses are clear. Note that
the frontal sinuses are virtually aplastic. Visualized orbits appear
symmetric bilaterally.

Other: Mastoid air cells are clear.
IMPRESSION: Mild atrophy, stable. Patchy small vessel disease throughout the
centra semiovale bilaterally and to a lesser extent each thalamus.
Scattered lacunar infarcts in the periventricular white matter,
stable. No acute infarct evident. No mass, hemorrhage, or
extra-axial fluid collection. There are foci of arterial vascular
calcification.
